# Patient Record
Sex: Male | Born: 1935 | Race: White | Hispanic: No | Marital: Married | Smoking: Never smoker
Health system: Southern US, Community
[De-identification: ages and names within clinical notes are randomized; demographics above are authoritative.]

## PROBLEM LIST (undated history)

## (undated) DIAGNOSIS — I10 Essential (primary) hypertension: Secondary | ICD-10-CM

## (undated) DIAGNOSIS — I639 Cerebral infarction, unspecified: Secondary | ICD-10-CM

## (undated) DIAGNOSIS — G629 Polyneuropathy, unspecified: Secondary | ICD-10-CM

## (undated) DIAGNOSIS — I4891 Unspecified atrial fibrillation: Secondary | ICD-10-CM

## (undated) DIAGNOSIS — I739 Peripheral vascular disease, unspecified: Secondary | ICD-10-CM

## (undated) DIAGNOSIS — I351 Nonrheumatic aortic (valve) insufficiency: Secondary | ICD-10-CM

## (undated) DIAGNOSIS — I340 Nonrheumatic mitral (valve) insufficiency: Secondary | ICD-10-CM

## (undated) DIAGNOSIS — D510 Vitamin B12 deficiency anemia due to intrinsic factor deficiency: Secondary | ICD-10-CM

## (undated) DIAGNOSIS — Z8673 Personal history of transient ischemic attack (TIA), and cerebral infarction without residual deficits: Secondary | ICD-10-CM

## (undated) HISTORY — DX: Unspecified atrial fibrillation: I48.91

## (undated) HISTORY — DX: Essential (primary) hypertension: I10

## (undated) HISTORY — DX: Nonrheumatic mitral (valve) insufficiency: I34.0

## (undated) HISTORY — DX: Nonrheumatic aortic (valve) insufficiency: I35.1

## (undated) HISTORY — DX: Personal history of transient ischemic attack (TIA), and cerebral infarction without residual deficits: Z86.73

---

## 1998-10-27 ENCOUNTER — Ambulatory Visit (HOSPITAL_COMMUNITY): Admission: RE | Admit: 1998-10-27 | Discharge: 1998-10-27 | Payer: Self-pay | Admitting: Gastroenterology

## 2000-02-26 ENCOUNTER — Emergency Department (HOSPITAL_COMMUNITY): Admission: EM | Admit: 2000-02-26 | Discharge: 2000-02-26 | Payer: Self-pay | Admitting: Emergency Medicine

## 2002-05-24 ENCOUNTER — Encounter: Payer: Self-pay | Admitting: Urology

## 2002-05-24 ENCOUNTER — Encounter: Admission: RE | Admit: 2002-05-24 | Discharge: 2002-05-24 | Payer: Self-pay | Admitting: Urology

## 2002-08-18 ENCOUNTER — Encounter: Payer: Self-pay | Admitting: Internal Medicine

## 2002-08-18 ENCOUNTER — Encounter: Admission: RE | Admit: 2002-08-18 | Discharge: 2002-08-18 | Payer: Self-pay | Admitting: Internal Medicine

## 2002-09-07 ENCOUNTER — Encounter: Payer: Self-pay | Admitting: Internal Medicine

## 2002-09-07 ENCOUNTER — Ambulatory Visit (HOSPITAL_COMMUNITY): Admission: RE | Admit: 2002-09-07 | Discharge: 2002-09-07 | Payer: Self-pay | Admitting: Internal Medicine

## 2003-10-29 ENCOUNTER — Inpatient Hospital Stay (HOSPITAL_COMMUNITY): Admission: EM | Admit: 2003-10-29 | Discharge: 2003-11-04 | Payer: Self-pay | Admitting: Emergency Medicine

## 2003-10-31 ENCOUNTER — Encounter (INDEPENDENT_AMBULATORY_CARE_PROVIDER_SITE_OTHER): Payer: Self-pay | Admitting: Cardiology

## 2004-09-11 ENCOUNTER — Ambulatory Visit (HOSPITAL_COMMUNITY): Admission: RE | Admit: 2004-09-11 | Discharge: 2004-09-11 | Payer: Self-pay | Admitting: Gastroenterology

## 2006-07-22 ENCOUNTER — Encounter: Admission: RE | Admit: 2006-07-22 | Discharge: 2006-07-22 | Payer: Self-pay | Admitting: Family Medicine

## 2007-11-13 ENCOUNTER — Ambulatory Visit: Payer: Self-pay | Admitting: Vascular Surgery

## 2010-01-18 ENCOUNTER — Ambulatory Visit: Payer: Self-pay | Admitting: Cardiology

## 2010-11-02 NOTE — Discharge Summary (Signed)
NAMEJEFFIE, SPIVACK                     ACCOUNT NO.:  [id number]   MEDICAL RECORD NO.:  [id number]                   PATIENT TYPE:  INP   LOCATION:  3002                                 FACILITY:  [facility]   PHYSICIAN:  Geoffry Paradise, M.D.               DATE OF BIRTH:  06-Jan-1925   DATE OF ADMISSION:  10/29/2003  DATE OF DISCHARGE:                                 DISCHARGE SUMMARY   DISCHARGE DIAGNOSES:  1. Right subcortical infarction, embolic.  2. Paroxysmal atrial fibrillation, controlled response.  3. Essential hypertension.  4. Gastroesophageal reflux disease.   HISTORY OF PRESENT ILLNESS:  Mr. Grave is a 77 year old gentleman with a  remote history of stroke one year prior, presenting at this time with  slurred speech and left-sided weakness.  He did have an episode of left  facial drooping and dysarthria with left hemiparesis back in March of 2004,  with negative carotid Dopplers, and CNS workup which was otherwise  unrevealing.  He has seen Dr. Elease Hashimoto in the past, two to three years ago,  with no ongoing care needed.  He represents at this time with slurred speech  and progressive left-sided weakness with the new finding being atrial  fibrillation with a slow ventricular response.  For further details, see the  dictated summary in the chart.   LABORATORY DATA:  MRI of the brain with and without contrast revealed an acute, nonhemorrhagic  infarct, posterior/ superior aspect basal ganglia extending into the  posterior aspect of the corona radiata on the right, atrophy, small vessel  disease and old infarcts, and intracranial atherosclerotic changes most  notable involving branches of the circle of Willis.  CT scan revealed no  bleed, and an old right frontal infarct, atrophy, nonspecific white matter  changes.  Carotid Dopplers were negative.   EKG:  Initially atrial fibrillation response in the 40's and 50's,  subsequently sinus bradycardia, first degree AV  block.   LABORATORY DATA:  Urine culture, no growth.  C-reactive protein 0.1.  Folate  8.8.  B12 low at 165.  TSH 1.77.  Chemistry:  Sodium 140, potassium 4.5,  chloride 108, cO2 28, glucose 105, BUN 16, creatinine 1.6, calcium 9.1,  protein 6.6, albumin 3.7, SGOT 26, SGPT 16, alkaline phosphatase 48,  bilirubin 1.1.  Homocystine elevated at 23.5.   HOSPITAL COURSE:  The patient was admitted, heparinized, given new onset  atrial fibrillation and findings, and subsequently converted to Coumadin for  long-term Coumadin therapy.  Consultations included Dr. Fraser Din of  cardiology, and Dr. Pearlean Brownie of neurology.  There was no evidence of the need  for vascular intervention.  Echocardiogram revealed no evidence of thrombus,  normal LV function.  The patient is to be scheduled by Dr. Windell Moulding  for an outpatient Cardiolite to rule out any underlying ischemia.  OT/ PT  and speech did see Mr. Zarcone, but he had subtotal recovery with total  independence, and excellent safety  recommendations for home disposition.  He  mobilized without difficulty.  Atenolol was held secondary to the  bradycardia, and even off the atenolol, his heart maintained in the 60's and  70's at best with exertion on monitor.  Blood pressure was controlled on  lisinopril.  He was supplemented with B12, 1000 micrograms and Folic acid,  given his B12 level and elevated homocystine.   DISCHARGE CONDITION:  The patient is discharged in improved and stable  condition.   DISCHARGE MEDICATIONS:  1. Coumadin 5 mg, 1.5 daily.  2. Lisinopril 20 mg, one daily.  3. Foltx/ folic acid one daily.   NEW MEDICATIONS:  Coumadin and Foltx called to [facility] at [employer].   DISCHARGE INSTRUCTIONS:  1. The patient will see medication management for prothrombin time     monitoring starting Tuesday at 9 a.m.  2. No driving for one week.  3. He will also receive vitamin B12, 1000 micrograms monthly at the time of     his Coumadin  monitoring.                                                Geoffry Paradise, M.D.    RA/MEDQ  D:  11/04/2003  T:  11/06/2003  Job:  [id number]

## 2010-11-02 NOTE — Consult Note (Signed)
NAME:  Darryl Summers, Darryl Summers                     ACCOUNT NO.:  [id number]   MEDICAL RECORD NO.:  [id number]                   PATIENT TYPE:  INP   LOCATION:  3002                                 FACILITY:  [facility]   PHYSICIAN:  Darryl Summers, M.D.               DATE OF BIRTH:  06/22/1924   DATE OF CONSULTATION:  11/01/2003  DATE OF DISCHARGE:                                   CONSULTATION   HISTORY OF PRESENT ILLNESS:  This is a 77 year old, pleasant, right handed  Caucasian gentleman who has a history of right hemispheric CVA a year ago  which caused him at that time to have left-sided numbness, clumsiness,  unsteady gait with drift to the right, inability to button his shirt and  transiently dysarthria.  The patient has recovered well until he developed  fairly similar symptoms just on Oct 29, 2003 and was admitted for stroke  workup by Dr. Lanell Summers group for slurring of speech and left leg numbness.  Due to the history of prior stroke, a comparison MRI was obtained and showed  that the patient had multiple lacunar infarctions and posterior and superior  basal ganglia bilaterally, mostly on the right.  There was encephalomalacia  from his healed stroke in the anterior right frontal lobe.  The left centrum  semiovale also shows lacune as well as the right posterior corona radiata  region into the occipital lobe.  The impression of the MRI was that the  patient had an acute ischemic small infarct along the posterior superior  aspect of the basal ganglia extending into the posterior aspect of the  corona radiata, some atrophy, multiple old infarcts with encephalomalacia  and hemorrhagic breakdown and different degrees of metabolization.  An MRA  was also obtained showing left posterior and inferior cerebral arteries were  not.  No significant focal stenosis of the basilar and vertebral artery with  some atherosclerotic type changes along the anterior circulation of both  __________  arteries.  I do not see an aneurysm.   PAST MEDICAL HISTORY:  1. Hypertension.  2. CVA, right hemispheric.  3. Increased homocystine.  4. Decreased vitamin B12 levels.  5. Newly diagnosed atrial fibrillation.  6. Gastroesophageal reflux.   FAMILY HISTORY:  Noncontributory.   SOCIAL HISTORY:  The patient is retired, married, has an active lifestyle.  Said that he is not drinking or smoking.   PHYSICAL EXAMINATION:  VITAL SIGNS:  Stable.  Afebrile.  Blood pressure  135/87, heart rate 50-60, irregular with great variability during the day.  Temperature 96 degrees.  LUNGS:  Clear to auscultation.  CARDIOVASCULAR:  No carotid bruits, no goiter.  No other bruits.  All  peripheral pulses are palpable.  No cyanosis or edema.   NEUROLOGIC EXAMINATION:  Alert and oriented x 3.  No aphasia, apraxia,  dysarthria.  Memory is intact to recent events.  The patient is very  cooperative.  Shows no aphasia either.  Cranial nerves:  Pupils are equal, reactive to light and accommodation.  Extraocular movements are intact.  Full visual fields to bilateral  simultaneous stimulation.  No papilledema.  Facial has mild asymmetry with  left lower facial droop.  The patient has good bilateral eye closure, tongue  and jugular are midline,  gag is intact.   Motor shows pronator drift on the left but fairly equal strength.  Tone is  mild throughout.  Deep tendon reflexes are elevated to 2+ versus 1+ on the  left.  There is also upgoing Babinski response on the left but no clonus.  Sensory is decreased to primary modalities on the left.  His left palm is  unable to identify objects with the same certainty as his right so that I  would describe this as a cortical sensation loss.   ASSESSMENT:  Not likely to be a recent stroke by MRA. This patient has  rather a new lacunar or perhaps had some TIA-like symptoms before a larger  blood clot broke up.  Since he has been diagnosed with atrial fibrillation,   this was most likely an embolic event.  I agree with the continuation of  Coumadin as given now.  I also agree with baby aspirin given his degree of  small vessel disease and thromboembolic event, of course, can often not be  treated with Coumadin alone.  I also agree with Dr. Lanell Summers approach to  replace vitamin B12 complex, especially folic acid should be given and the  patient should probably  be evaluated by occupational therapy for  manifestations of __________ dexterity and gait stability might also be best  evaluated by physical therapy.  A lipid profile was also obtained.  A 2-D  echocardiogram was ordered by Dr. Jacky Summers as well as multiple laboratory  studies.  Stroke service will follow Darryl Summers through his hospital stay and  he is likely to follow up the patient if he agrees to at [facility], [address], [place], [place].  Given the  patient's high degree of homocystine, it would be recommended that his  children should also be tested for this probably inherited metabolic trait.                                               Darryl Summers, M.D.    CD/MEDQ  D:  11/01/2003  T:  11/02/2003  Job:  [id number]   cc:   Darryl Summers, M.D.  [address]  [place]  [place] [address]  Fax: [phone]

## 2010-11-02 NOTE — H&P (Signed)
Darryl Summers, Darryl Summers                     ACCOUNT NO.:  [id number]   MEDICAL RECORD NO.:  [id number]                   PATIENT TYPE:  INP   LOCATION:  3002                                 FACILITY:  [facility]   PHYSICIAN:  Meade Maw, M.D.                 DATE OF BIRTH:  [birth year]   DATE OF ADMISSION:  10/29/2003  DATE OF DISCHARGE:                                HISTORY & PHYSICAL   INDICATION FOR CONSULTATION:  Paroxysmal atrial fibrillation.   HISTORY:  Darryl Summers is a very pleasant 77 year old gentleman who has a  past medical history of a CVA in 2004 at which time he suffered left facial  drooping, dysarthria, and left-sided weakness. He presented on Oct 29, 2003  with complaints of slurred speech and left-sided weakness. Two days prior to  this presentation he was noted to have worsening of his left upper extremity  and lower extremity weakness. He presented to the emergency room and was  found to be in atrial fibrillation with a slow ventricular response. He  notes that prior to this admission he had been active. He was walking three  times per week with his wife. The two days prior to this presentation he  noted disproportionate fatigue with exercise. He however, denied  palpitations, tachy arrhythmia, or dizziness. He has been evaluated by Dr.  Delane Ginger two to three years prior. He is unsure as to what workup was  performed. He states that he feels stable on his feet. He only had dizziness  with abrupt changes in position or stumble when he is rushing.   PAST MEDICAL HISTORY:  1. Significant for gastrointestinal reflux disease.  2. Has been twice on Plavix therapy.  3. Hypertension.  4. CVA as noted above.   PAST SURGICAL HISTORY:  Significant for vasectomy. He has no reported  history of dyslipidemia.   MEDICATIONS PRIOR TO ADMISSION:  1. Plavix 75 mg daily.  2. Atenolol twice daily, dose was unknown.  3. Lisinopril 20 mg daily.  4. Aspirin 81 mg daily.  5. Prilosec 20 mg daily.   CURRENT MEDICATIONS:  1. Protonix 40 mg daily.  2. Aspirin 81 mg daily.  3. Atenolol 25 mg b.i.d. Atenolol placed on hold on Nov 01, 2003.  4. Lisinopril 10 mg daily.  5. Coumadin adjusted to maintain an INR of 2-3.  6. Folic acid 2 mg daily.  7. Heparin IV.  8. Tylenol p.r.n.   ALLERGIES:  No known drug allergies.   SOCIAL HISTORY:  He has been married since 1952. His wife's name is Kendal Hymen.  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 He has a remote history of cigar  smoking and has had an active lifestyle in the past playing college  basketball and baseball. There is no history of alcohol, tobacco, or illicit  drug use.   FAMILY HISTORY:  His father passed away at 71 from a hip fracture. Mother  passed away at 63 from a myocardial infarction. He has two sons who are in  good health.   REVIEW OF SYSTEMS:  As noted above. He denies near falls. States that his  gait is only unstable when he rushes. He has had no blood in his stool. He  denies black tarry stools. He has had no orthopnea, pedal edema,  palpitations. He has had no recent upper respiratory infection.   PHYSICAL EXAMINATION:  GENERAL:  An elderly male in no acute distress.  VITAL SIGNS:  Systolic blood pressure ranging from 120-140 with a diastolic  80s. Telemetry has revealed atrial fibrillation, currently he is in sinus  rhythm with a marked first degree AV block.  HEENT:  Unremarkable.  NECK:  He has good carotid upstrokes. No carotid bruit. No neck vein  distention.  PULMONARY:  Breath sounds which are equal and clear to auscultation.  CARDIOVASCULAR:  Regular rate and rhythm. There is no rubs, murmurs, or  gallops noted. PMI is not displaced.  ABDOMEN:  Soft, benign, nontender.  EXTREMITIES:  No peripheral edema.  SKIN:  Warm and dry.  NEUROLOGIC:  As per neurology.   LABORATORY DATA:  INR is 1.2, white count 5.5, hemoglobin 12.9, hematocrit  37.1, platelets 164. His homocystine  level is noted to be elevated at 23.  LDL is 109, HDL is 43. Creatinine is 1.6. Potassium is 4.5. Sodium 140.  Normal liver enzymes. ECG reveals a marked first degree AV block. QTC at 415  milliseconds. His ECG upon admission revealed atrial fibrillation with a  ventricular response of 50 beats per minute. ECG on February 13 revealed  sinus rhythm with normal R-wave progression, normal QTC. His MRI reveals an  acute, non-hemorrhagic small infarct in the basil ganglia extending into the  right corona radiata. There is also atrophy noted and a small focal area of  hemorrhagic breakdown within the left cerebellum, which may be related to a  prior smaller hemorrhagic infarct.   IMPRESSION:  1. Atrial fibrillation, paroxysmal in nature. The patient has been started     on Coumadin. He appears to be a safe candidate. We will continue with     Coumadin for an INR of 2-3.  As the patient now is having sinus rhythm     with marked first degree AV block would not add further antiarrhythmic     therapy unless he develops recurrent atrial fibrillation. I agree with     the holding of his Atenolol. His bradycardia was at night and was     asymptomatic. We will discontinue Plavix, but would continue with aspirin     at 81 mg daily. He will need further risk stratification, but would     prefer to wait until he is able to walk without difficulty. A stress     Cardiolite would allow [place] to assess for ischemia as well for possible     exercise induced atrial fibrillation. Should he have recurrent atrial     fibrillation, would consider further antiarrhythmic therapy.  2. Hypertension.  Blood pressure is currently well-controlled, but may need     to continue to follow as the patient has discontinued his Atenolol.  3. Homocystinuria.  Agree with folic acid, B-6, and B-12 replacement.  Meade Maw, M.D.   HP/MEDQ  D:  11/01/2003  T:  11/01/2003  Job:   [id number]

## 2010-11-02 NOTE — Op Note (Signed)
NAMESHEMUEL, HARKLEROAD                ACCOUNT NO.:  [id number]   MEDICAL RECORD NO.:  [id number]          PATIENT TYPE:  AMB   LOCATION:  ENDO                         FACILITY:  [facility]   PHYSICIAN:  John C. Madilyn Fireman, M.D.    DATE OF BIRTH:  1925/03/06   DATE OF PROCEDURE:  09/11/2004  DATE OF DISCHARGE:                                 OPERATIVE REPORT   PROCEDURE:  Colonoscopy.   INDICATIONS FOR PROCEDURE:  Family history of colon cancer in a first-degree  relative.   DESCRIPTION OF PROCEDURE:  The patient was placed in the left lateral  decubitus position and placed on the pulse monitor with continuous low flow  oxygen delivered by nasal cannula.  He was sedated with 60 mcg IV Fentanyl  and 6 mg IV Versed.  The Olympus videocolonoscope was inserted into the  rectum and advanced to the cecum, confirmed by transillumination of  McBurney's point and visualization of the ileocecal valve and appendiceal  orifice.  The prep was excellent.  The cecum, ascending, and transverse  colon all appeared normal, with no masses, polyps, diverticula, or other  mucosal abnormalities.  Within the descending and sigmoid colon there was  some scattered diverticula but no other abnormalities.  The rectum appeared  normal.  On retroflex view, the anus revealed no obvious hemorrhoids.  The  scope was then withdrawn and the patient taken to the recovery room in  stable condition.  He tolerated the procedure well.  There were no immediate  complications.   IMPRESSION:  Sigmoid and descending colon diverticulosis.   PLAN:  Repeat colonoscopy in five years based on family history.       ___________________________________________  Everardo All Madilyn Fireman, M.D.    JCH/MEDQ  D:  09/11/2004  T:  09/11/2004  Job:  [id number]   cc:   Geoffry Paradise, M.D.  [address]  [place]  [place] [address]  Fax: [phone]

## 2010-11-02 NOTE — H&P (Signed)
NAMEKILIAN, SCHWARTZ                     ACCOUNT NO.:  [id number]   MEDICAL RECORD NO.:  [id number]                   PATIENT TYPE:  EMS   LOCATION:  MAJO                                 FACILITY:  [facility]   PHYSICIAN:  Mark A. Perini, M.D.                DATE OF BIRTH:  [birth year]   DATE OF ADMISSION:  10/29/2003  DATE OF DISCHARGE:                                HISTORY & PHYSICAL   PRIMARY CARE PHYSICIAN:  Geoffry Paradise, M.D.   CHIEF COMPLAINT:  1. Slurred speech.  2. Left-sided weakness.   HISTORY OF PRESENT ILLNESS:  Mr. Yapp is a 77 year old male with a history  of stroke one year ago.  He most likely did have some residual deficit from  the previous stroke, however, two days ago he noted worsening versus new  slurred speech and dysarthria.  Also, he has noted new versus worsening left  upper extremity and left lower extremity weakness and clumsiness.  Today he  presented to the emergency room.  At that time, he was found to be in new  atrial fibrillation with a slow ventricular response.  He also does have  some objective physical exam findings.  He will require admission for  further evaluation and treatment.   PAST MEDICAL HISTORY:  1. Gastrointestinal reflux disease since he was placed on Plavix therapy.  2. Hypertension.  3. In March of 2004, he suffered a stroke which resulted in left facial     drooping, dysarthria, and some left-sided weakness.  Per the patient's     history, he had negative carotid Dopplers.  4. History of vasectomy.   No history of diabetes and reportedly no history of hyperlipidemia per the  patient.  He reportedly had an EKG two to three weeks ago at an annual  physical and was not told that there was anything wrong with it.   ALLERGIES:  No known allergies.   MEDICATIONS:  1. Plavix 75 mg daily.  2. Atenolol twice daily, unknown dose.  3. Lisinopril 20 mg daily.  4. Aspirin 81 mg daily.  5. Prilosec 20 mg daily.   SOCIAL  HISTORY:  He is married since 73.  His wife's name is Kendal Hymen.  He  has two sons and four grandchildren.  He is a remote cigar smoking history.  Nothing current.  No alcohol.  No drug use history.   FAMILY HISTORY:  His father died at age 50 of a hip fracture and  complications from that.  His mother died at age 51 of an MI.  His two sons  are in good health.   REVIEW OF SYSTEMS:  Mr. Naeem was in his usual state of health until two  days ago, at which time he started slurring his words.  However, for the  last couple of months he reports having a little more trouble with his gait.  He has had some close calls with falling, but  has not had any actual falls.  He has felt tired for the last several months as well.  He denies any  fevers, cough, or cold symptoms.  He says that his appetite has been normal  and he has had no dysphagia.  Normal bowel movements.  No blood from above  or below.  He denies chest pains or shortness of breath.  He denies any  palpitations whatsoever.   PHYSICAL EXAMINATION:  VITAL SIGNS:  Temperature 97.4 degrees, pulse 51,  respiratory rate 20, blood pressure 140/72, 98% saturation on 2 L of oxygen.  GENERAL APPEARANCE:  He is in no acute distress.  He is in very good spirits  and he is jovial.  HEENT:  Normocephalic and atraumatic.  Pupils equal, round, and reactive to  light.  Extraocular movements are intact.  There is no icterus and no  pallor.  NECK:  No JVD.  No carotid bruits.  NEUROLOGIC:  Cranial nerve testing reveals slight left facial droop,  otherwise normal.  He has grossly normal strength through the upper and  lower extremities bilaterally.  However, he does have trouble with finger-  nose-finger testing with the left hand.  He is also clumsy with left-sided  heel-to-shin testing.  He has no visual field deficits.  He has a slight  left pronator drift noted today.  Toes are equivocal bilaterally.  He does  have slightly accentuated left lower  extremity knee jerk reflexes versus the  right.  Other reflexes are all normal.  LUNGS:  Clear to auscultation bilaterally with no wheezing, rhonchi, or  rales.  HEART:  Irregularly irregular with a normal rate.  No murmur, rub, or gallop  noted.  ABDOMEN:  Soft with some vague left lower quadrant tenderness to exam.  No  rebound or guarding.  EXTREMITIES:  There is no peripheral edema.  There are 2+ distal pulses  noted.   LABORATORY DATA:  The INR is 1.0 and PTT 33.  Potassium 4.5, chloride 108,  CO2 28, BUN 16, creatinine 1.6, glucose 105, total bilirubin 105, alkaline  phosphatase 48, AST 26, ALT 16, total protein 6.6, albumin 3.7, calcium 9.1.  The white count is 5.9 with a normal differential, hemoglobin 15.1, platelet  count 139,000.  Cranial CT scan personally reviewed shows an old right  frontal stroke moderate in size.  There are some white matter changes and  atrophy, but no acute bleed or stroke noted.  EKG shows atrial fibrillation  with a slow ventricular response with a rate of 50 beats per minute.  There  are no significant ST-T wave changes.   ASSESSMENT AND PLAN:  A 77 year old male with new atrial fibrillation and  subacute new versus worsening left-sided neurologic symptoms.  I suspect he  has had a subacute small right brain CVA that is new.  Will admit him to a  telemetry bed.  Will anticoagulate with heparin and initiate Coumadin  therapy.  We will stop his Plavix, but we will most likely continue him on  81 mg aspirin therapy.  We will continue his proton pump inhibitor therapy.  We will check a 2-D echocardiogram, as well as MRI and MRA of the brain and  carotid Dopplers.  He may require neurology or cardiology opinion as well.  We will also check a fasting lipid profile, C-reactive protein, homocystine  level, hemoglobin A1C, TSH, B12, and folic acid level.  Mr. Banghart is a full  code status.  Mark A. Waynard Edwards,  M.D.    MAP/MEDQ  D:  10/29/2003  T:  10/29/2003  Job:  [id number]   cc:   Geoffry Paradise, M.D.  [address]  [place]  [place] [address]  Fax: [phone]

## 2010-11-27 ENCOUNTER — Emergency Department (HOSPITAL_COMMUNITY)
Admission: EM | Admit: 2010-11-27 | Discharge: 2010-11-27 | Disposition: A | Discharge status: Home / Self Care | Payer: Medicare Other | Attending: Emergency Medicine | Admitting: Emergency Medicine

## 2010-11-27 DIAGNOSIS — I10 Essential (primary) hypertension: Secondary | ICD-10-CM | POA: Insufficient documentation

## 2010-11-27 DIAGNOSIS — I4891 Unspecified atrial fibrillation: Secondary | ICD-10-CM | POA: Insufficient documentation

## 2010-11-27 DIAGNOSIS — Z7901 Long term (current) use of anticoagulants: Secondary | ICD-10-CM | POA: Insufficient documentation

## 2010-11-27 DIAGNOSIS — F0390 Unspecified dementia without behavioral disturbance: Secondary | ICD-10-CM | POA: Insufficient documentation

## 2010-11-27 DIAGNOSIS — Z8673 Personal history of transient ischemic attack (TIA), and cerebral infarction without residual deficits: Secondary | ICD-10-CM | POA: Insufficient documentation

## 2010-11-27 DIAGNOSIS — R079 Chest pain, unspecified: Secondary | ICD-10-CM | POA: Insufficient documentation

## 2010-11-27 DIAGNOSIS — E785 Hyperlipidemia, unspecified: Secondary | ICD-10-CM | POA: Insufficient documentation

## 2010-11-27 LAB — DIFFERENTIAL
Eosinophils Absolute: 0.3 10*3/uL (ref 0.0–0.7)
Eosinophils Relative: 4 % (ref 0–5)
Lymphocytes Relative: 24 % (ref 12–46)
Monocytes Relative: 10 % (ref 3–12)
Neutro Abs: 4.4 10*3/uL (ref 1.7–7.7)
Neutrophils Relative %: 62 % (ref 43–77)

## 2010-11-27 LAB — BASIC METABOLIC PANEL
BUN: 29 mg/dL — ABNORMAL HIGH (ref 6–23)
Chloride: 95 mEq/L — ABNORMAL LOW (ref 96–112)
Creatinine, Ser: 1.7 mg/dL — ABNORMAL HIGH (ref 0.4–1.5)
GFR calc Af Amer: 47 mL/min — ABNORMAL LOW (ref >60)
Glucose, Bld: 91 mg/dL (ref 70–99)
Potassium: 3.4 mEq/L — ABNORMAL LOW (ref 3.5–5.1)
Sodium: 136 mEq/L (ref 135–145)

## 2010-11-27 LAB — TROPONIN I: Troponin I: <0.3 ng/mL (ref <0.30)

## 2010-11-27 LAB — CK TOTAL AND CKMB (NOT AT ARMC)
CK, MB: 4.9 ng/mL — ABNORMAL HIGH (ref 0.3–4.0)
Relative Index: 1.5 (ref 0.0–2.5)

## 2010-11-27 LAB — PROTIME-INR
INR: 2.83 — ABNORMAL HIGH (ref 0.00–1.49)
Prothrombin Time: 29.8 seconds — ABNORMAL HIGH (ref 11.6–15.2)

## 2010-11-27 LAB — CBC: Platelets: 199 10*3/uL (ref 150–400)

## 2011-11-29 ENCOUNTER — Encounter: Payer: Self-pay | Admitting: Cardiology

## 2012-08-28 ENCOUNTER — Encounter: Payer: Self-pay | Admitting: Cardiovascular Disease

## 2012-08-28 ENCOUNTER — Encounter: Payer: Self-pay | Admitting: Cardiology

## 2013-03-05 ENCOUNTER — Emergency Department (HOSPITAL_COMMUNITY): Payer: Medicare Other

## 2013-03-05 ENCOUNTER — Encounter (HOSPITAL_COMMUNITY): Payer: Self-pay | Admitting: Neurology

## 2013-03-05 ENCOUNTER — Emergency Department (HOSPITAL_COMMUNITY)
Admission: EM | Admit: 2013-03-05 | Discharge: 2013-03-05 | Disposition: A | Discharge status: Other Acute Inpatient Hospital | Payer: Medicare Other | Attending: Emergency Medicine | Admitting: Emergency Medicine

## 2013-03-05 DIAGNOSIS — Y9389 Activity, other specified: Secondary | ICD-10-CM | POA: Insufficient documentation

## 2013-03-05 DIAGNOSIS — W050XXA Fall from non-moving wheelchair, initial encounter: Secondary | ICD-10-CM | POA: Insufficient documentation

## 2013-03-05 DIAGNOSIS — Z792 Long term (current) use of antibiotics: Secondary | ICD-10-CM | POA: Insufficient documentation

## 2013-03-05 DIAGNOSIS — N390 Urinary tract infection, site not specified: Secondary | ICD-10-CM | POA: Insufficient documentation

## 2013-03-05 DIAGNOSIS — I4891 Unspecified atrial fibrillation: Secondary | ICD-10-CM | POA: Insufficient documentation

## 2013-03-05 DIAGNOSIS — Z8673 Personal history of transient ischemic attack (TIA), and cerebral infarction without residual deficits: Secondary | ICD-10-CM | POA: Insufficient documentation

## 2013-03-05 DIAGNOSIS — S0990XA Unspecified injury of head, initial encounter: Secondary | ICD-10-CM

## 2013-03-05 DIAGNOSIS — I10 Essential (primary) hypertension: Secondary | ICD-10-CM | POA: Insufficient documentation

## 2013-03-05 DIAGNOSIS — W19XXXA Unspecified fall, initial encounter: Secondary | ICD-10-CM

## 2013-03-05 DIAGNOSIS — Z7901 Long term (current) use of anticoagulants: Secondary | ICD-10-CM | POA: Insufficient documentation

## 2013-03-05 DIAGNOSIS — Y9210 Unspecified residential institution as the place of occurrence of the external cause: Secondary | ICD-10-CM | POA: Insufficient documentation

## 2013-03-05 DIAGNOSIS — Z79899 Other long term (current) drug therapy: Secondary | ICD-10-CM | POA: Insufficient documentation

## 2013-03-05 HISTORY — DX: Cerebral infarction, unspecified: I63.9

## 2013-03-05 LAB — URINALYSIS, ROUTINE W REFLEX MICROSCOPIC
Ketones, ur: NEGATIVE mg/dL
Leukocytes, UA: NEGATIVE
Nitrite: NEGATIVE
Specific Gravity, Urine: 1.02 (ref 1.005–1.030)
Urobilinogen, UA: 0.2 mg/dL (ref 0.0–1.0)

## 2013-03-05 LAB — BASIC METABOLIC PANEL
Calcium: 9.1 mg/dL (ref 8.4–10.5)
GFR calc Af Amer: 33 mL/min — ABNORMAL LOW (ref >90)
Sodium: 137 mEq/L (ref 135–145)

## 2013-03-05 LAB — URINE MICROSCOPIC-ADD ON

## 2013-03-05 LAB — CBC WITH DIFFERENTIAL/PLATELET
Basophils Relative: 0 % (ref 0–1)
HCT: 45.7 % (ref 39.0–52.0)
Lymphocytes Relative: 15 % (ref 12–46)
Lymphs Abs: 1.7 10*3/uL (ref 0.7–4.0)
MCHC: 34.6 g/dL (ref 30.0–36.0)
MCV: 96.2 fL (ref 78.0–100.0)
Monocytes Absolute: 1 10*3/uL (ref 0.1–1.0)
Monocytes Relative: 9 % (ref 3–12)
Platelets: 150 10*3/uL (ref 150–400)
RBC: 4.75 MIL/uL (ref 4.22–5.81)
WBC: 11.1 10*3/uL — ABNORMAL HIGH (ref 4.0–10.5)

## 2013-03-05 LAB — PROTIME-INR: Prothrombin Time: 19.7 seconds — ABNORMAL HIGH (ref 11.6–15.2)

## 2013-03-05 MED ORDER — CEPHALEXIN 500 MG PO CAPS: 500.0000 mg | ORAL_CAPSULE | Freq: Two times a day (BID) | ORAL | Status: DC

## 2013-03-05 NOTE — ED Provider Notes (Signed)
CSN: [id number]     Arrival date & time 03/05/13  1033 History   First MD Initiated Contact with Patient 03/05/13 1033     Chief Complaint  Patient presents with  . Fall   (Consider location/radiation/quality/duration/timing/severity/associated sxs/prior Treatment) HPI This is an 79 yo on coumadin for afib who presents from his assisted living following a fall.  Patient only oriented to self.  States "I didn't want to come."  Patient does not contribute to history taking.  Per son, patient has had 2-3 falls over the last 24 hours.  Last fall was secondary to handle beside the toilet pulling out of the wall.  Patient hit his head on the wheelchair.  No LOC.  He reports increasing confusion without notable focal deficit.  No reported fevers. Past Medical History  Diagnosis Date  . Atrial fibrillation   . History of stroke   . Hypertension   . Aortic insufficiency   . Moderate mitral regurgitation   . Stroke    History reviewed. No pertinent past surgical history. No family history on file. History  Substance Use Topics  . Smoking status: Never Smoker   . Smokeless tobacco: Not on file  . Alcohol Use: No    Review of Systems  Unable to perform ROS: Dementia    Allergies  Review of patient's allergies indicates no known allergies.  Home Medications   Current Outpatient Rx  Name  Route  Sig  Dispense  Refill  . divalproex (DEPAKOTE) 125 MG DR tablet   Oral   Take 125 mg by mouth 2 (two) times daily.         Marland Kitchen donepezil (ARICEPT) 10 MG tablet   Oral   Take 10 mg by mouth at bedtime.         Marland Kitchen FA-Pyridoxine-Cyancobalamin (FOLBIC PO)   Oral   Take 1 tablet by mouth daily.          . hydrochlorothiazide (HYDRODIURIL) 25 MG tablet   Oral   Take 12.5 mg by mouth daily.         . memantine (NAMENDA) 10 MG tablet   Oral   Take 10 mg by mouth 2 (two) times daily.         Marland Kitchen warfarin (COUMADIN) 5 MG tablet   Oral   Take 2.5-5 mg by mouth daily. Except  Wednesdays take 2.5 mg tablet.         . cephALEXin (KEFLEX) 500 MG capsule   Oral   Take 1 capsule (500 mg total) by mouth 2 (two) times daily.   14 capsule   0    BP 128/51  Pulse 71  Temp(Src) 98.3 F (36.8 C) (Oral)  Resp 13  SpO2 95% Physical Exam  Nursing note and vitals reviewed. Constitutional: He appears well-developed and well-nourished. No distress.  HENT:  Head: Normocephalic and atraumatic.  Right Ear: External ear normal.  Left Ear: External ear normal.  Mouth/Throat: Oropharynx is clear and moist.  Eyes: EOM are normal. Pupils are equal, round, and reactive to light.  Neck: Neck supple.  No c spine tenderness  Cardiovascular: Normal rate, regular rhythm and normal heart sounds.   No murmur heard. Pulmonary/Chest: Effort normal and breath sounds normal. No respiratory distress. He has no wheezes.  Abdominal: Soft. Bowel sounds are normal. There is no tenderness. There is no rebound.  Musculoskeletal: He exhibits no edema.  Lymphadenopathy:    He has no cervical adenopathy.  Neurological: He is alert.  Oriented to  person only, moves all four extremities, follows commands, CN 2-12 grossly intact  Skin: Skin is warm and dry.  Not ecchymosis noted  Psychiatric: He has a normal mood and affect.    ED Course  Procedures (including critical care time) Labs Review Labs Reviewed  CBC WITH DIFFERENTIAL - Abnormal; Notable for the following:    WBC 11.1 (*)    Neutro Abs 8.3 (*)    All other components within normal limits  PROTIME-INR - Abnormal; Notable for the following:    Prothrombin Time 19.7 (*)    INR 1.72 (*)    All other components within normal limits  BASIC METABOLIC PANEL - Abnormal; Notable for the following:    Glucose, Bld 112 (*)    BUN 28 (*)    Creatinine, Ser 2.04 (*)    GFR calc non Af Amer 28 (*)    GFR calc Af Amer 33 (*)    All other components within normal limits  URINALYSIS, ROUTINE W REFLEX MICROSCOPIC - Abnormal; Notable for  the following:    Hgb urine dipstick MODERATE (*)    Protein, ur 30 (*)    All other components within normal limits  URINE MICROSCOPIC-ADD ON   Imaging Review Ct Head Wo Contrast  03/05/2013   CLINICAL DATA:  History of dementia, fall  EXAM: CT HEAD WITHOUT CONTRAST  TECHNIQUE: Contiguous axial images were obtained from the base of the skull through the vertex without intravenous contrast.  COMPARISON:  10/29/2003  FINDINGS: No skull fracture is noted. Stable cerebral atrophy. No intracranial hemorrhage, mass effect or midline shift.  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 No acute cortical infarction. No mass lesion is noted on this unenhanced scan. Again noted periventricular and patchy subcortical white matter decreased attenuation consistent with chronic small vessel ischemic changes. Ventricular size is stable from prior exam.  IMPRESSION: No acute intracranial abnormality. Again noted encephalomalacia due to old infarct in right frontal lobe. Stable cerebral atrophy and chronic white matter disease.   Electronically Signed   By: Natasha Mead   On: 03/05/2013 13:16    MDM   1. Fall at nursing home, initial encounter   2. Minor head injury without loss of consciousness, initial encounter   3. UTI (lower urinary tract infection)    79 yo male with fall.  Falls described as mechanical.  No syncope or seizure noted.  Nontoxic and without evidence fo trauma.  Non-focal on neuro exam.  CT head negative for acute abnormality.  Urine to +leuks and bacteria which would be abnormal for a male.  Culture sent.  Patient will be treated for UTI.  Discussed work-up and findings with son.  Patient agrees with plan.  After history, exam, and medical workup I feel the patient has been appropriately medically screened and is safe for discharge home. Pertinent diagnoses were discussed with the patient. Patient was given return precautions.    Shon Baton, MD 03/05/13  [phone]

## 2013-03-05 NOTE — ED Notes (Signed)
Pt taken back to [facility] with [facility]. Rx provided for UTI. Report called to facility. Pt at baseline at time of discharge.

## 2013-03-05 NOTE — ED Notes (Signed)
Called CT to check on when patient will be coming over, reporting he is one of the next ones they will send for.

## 2013-03-05 NOTE — ED Notes (Signed)
Myself and Tramaine, EMT undressed pt, placed in gown, on monitor, continuous pulse oximetry and blood pressure cuff and performed vitals; family at bedside

## 2013-03-05 NOTE — ED Notes (Signed)
[facility] notified pt needs transport.

## 2013-03-05 NOTE — ED Notes (Signed)
Pt comes from [facility] where he lives. He has hx of dementia. Today he had a fall 2-3 ft from wheelchair and pulled the pull handle out of the wall. He hit his head on the foot of the wheelchair and does take coumadin. He also fell last night but his son did not want transport. Pt son reporting that he is at baseline as far as confusion and pt reports he does not want to be here. Staff reports that he is more angry than normal, question a UTI?  Pt is denying any pain, BP 146/90, HR 86 irregular with hx of AFIB. CBG 152.

## 2013-05-07 ENCOUNTER — Emergency Department (HOSPITAL_COMMUNITY)
Admission: EM | Admit: 2013-05-07 | Discharge: 2013-05-07 | Disposition: A | Discharge status: Home / Self Care | Payer: Medicare Other | Attending: Emergency Medicine | Admitting: Emergency Medicine

## 2013-05-07 ENCOUNTER — Encounter (HOSPITAL_COMMUNITY): Payer: Self-pay | Admitting: Emergency Medicine

## 2013-05-07 ENCOUNTER — Emergency Department (HOSPITAL_COMMUNITY): Payer: Medicare Other

## 2013-05-07 DIAGNOSIS — Y9239 Other specified sports and athletic area as the place of occurrence of the external cause: Secondary | ICD-10-CM | POA: Insufficient documentation

## 2013-05-07 DIAGNOSIS — Z8673 Personal history of transient ischemic attack (TIA), and cerebral infarction without residual deficits: Secondary | ICD-10-CM | POA: Insufficient documentation

## 2013-05-07 DIAGNOSIS — S0083XA Contusion of other part of head, initial encounter: Secondary | ICD-10-CM

## 2013-05-07 DIAGNOSIS — W010XXA Fall on same level from slipping, tripping and stumbling without subsequent striking against object, initial encounter: Secondary | ICD-10-CM | POA: Insufficient documentation

## 2013-05-07 DIAGNOSIS — I10 Essential (primary) hypertension: Secondary | ICD-10-CM | POA: Insufficient documentation

## 2013-05-07 DIAGNOSIS — Y9389 Activity, other specified: Secondary | ICD-10-CM | POA: Insufficient documentation

## 2013-05-07 DIAGNOSIS — Z7901 Long term (current) use of anticoagulants: Secondary | ICD-10-CM | POA: Insufficient documentation

## 2013-05-07 DIAGNOSIS — IMO0002 Reserved for concepts with insufficient information to code with codable children: Secondary | ICD-10-CM | POA: Insufficient documentation

## 2013-05-07 DIAGNOSIS — Z23 Encounter for immunization: Secondary | ICD-10-CM | POA: Insufficient documentation

## 2013-05-07 DIAGNOSIS — S0003XA Contusion of scalp, initial encounter: Secondary | ICD-10-CM | POA: Insufficient documentation

## 2013-05-07 DIAGNOSIS — Z79899 Other long term (current) drug therapy: Secondary | ICD-10-CM | POA: Insufficient documentation

## 2013-05-07 DIAGNOSIS — I4891 Unspecified atrial fibrillation: Secondary | ICD-10-CM | POA: Insufficient documentation

## 2013-05-07 DIAGNOSIS — I080 Rheumatic disorders of both mitral and aortic valves: Secondary | ICD-10-CM | POA: Insufficient documentation

## 2013-05-07 LAB — PROTIME-INR: INR: 2.17 — ABNORMAL HIGH (ref 0.00–1.49)

## 2013-05-07 MED ORDER — TETANUS-DIPHTH-ACELL PERTUSSIS 5-2.5-18.5 LF-MCG/0.5 IM SUSP
0.5000 mL | Freq: Once | INTRAMUSCULAR | Status: AC
  Administered 2013-05-07: 0.5 mL via INTRAMUSCULAR
  Filled 2013-05-07: qty 0.5

## 2013-05-07 NOTE — ED Notes (Addendum)
Per ems pt from [facility] s/p fall lac to nose pt is on blood thinner(Jantoven) denies loc pt on arrival awake and alert.Bleeding controlled

## 2013-05-07 NOTE — ED Provider Notes (Signed)
CSN: [id number]     Arrival date & time 05/07/13  2104 History   First MD Initiated Contact with Patient 05/07/13 2133     Chief Complaint  Patient presents with  . Fall   (Consider location/radiation/quality/duration/timing/severity/associated sxs/prior Treatment) Patient is a 79 y.o. male presenting with fall. The history is provided by the patient and the EMS personnel. The history is limited by the condition of the patient.  Fall Pertinent negatives include no chest pain, no abdominal pain, no headaches and no shortness of breath.  pt with hx afib, on coumadin, presents from [facility] s/p fall. Pt notes trip and fall, fell forward. Denies faintness, dizziness or loc. Contusion to face/nose. Abrasion to nose. Tetanus unknown. Pt denies pain. No neck or back pain. No cp or sob. No abd pain or nv. Pt poor historian - level 5 caveat.      Past Medical History  Diagnosis Date  . Atrial fibrillation   . History of stroke   . Hypertension   . Aortic insufficiency   . Moderate mitral regurgitation   . Stroke    History reviewed. No pertinent past surgical history. History reviewed. No pertinent family history. History  Substance Use Topics  . Smoking status: Never Smoker   . Smokeless tobacco: Not on file  . Alcohol Use: No    Review of Systems  Constitutional: Negative for fever.  HENT: Negative for sore throat.   Eyes: Negative for redness.  Respiratory: Negative for shortness of breath.   Cardiovascular: Negative for chest pain.  Gastrointestinal: Negative for vomiting and abdominal pain.  Genitourinary: Negative for flank pain.  Musculoskeletal: Negative for back pain and neck pain.  Skin: Negative for rash.  Neurological: Negative for headaches.  Hematological: Does not bruise/bleed easily.  Psychiatric/Behavioral: Negative for confusion.    Allergies  Review of patient's allergies indicates no known allergies.  Home Medications   Current Outpatient Rx  Name  Route   Sig  Dispense  Refill  . acetaminophen (TYLENOL) 325 MG tablet   Oral   Take 650 mg by mouth every 6 (six) hours as needed for mild pain.         . divalproex (DEPAKOTE) 125 MG DR tablet   Oral   Take 125 mg by mouth 2 (two) times daily.         Marland Kitchen donepezil (ARICEPT) 10 MG tablet   Oral   Take 10 mg by mouth at bedtime.         Marland Kitchen FA-Pyridoxine-Cyancobalamin (FOLBIC PO)   Oral   Take 1 tablet by mouth daily.          . hydrochlorothiazide (HYDRODIURIL) 25 MG tablet   Oral   Take 12.5 mg by mouth daily.         Marland Kitchen loperamide (IMODIUM) 2 MG capsule   Oral   Take 2 mg by mouth every 8 (eight) hours as needed for diarrhea or loose stools.         Marland Kitchen LORazepam (ATIVAN) 1 MG tablet   Oral   Take 1 mg by mouth every 6 (six) hours as needed for anxiety.         . memantine (NAMENDA) 10 MG tablet   Oral   Take 10 mg by mouth 2 (two) times daily.         Marland Kitchen warfarin (COUMADIN) 5 MG tablet   Oral   Take 2.5-5 mg by mouth daily. Jantoven. Take 5 mg on Tuesday, Wednesday, Friday, Saturday, and  Sunday.  Take 2.5 mg on Monday and Thursday.          BP 149/74  Pulse 75  Temp(Src) 97.5 F (36.4 C) (Oral)  Resp 20  SpO2 97% Physical Exam  Nursing note and vitals reviewed. Constitutional: He appears well-developed and well-nourished. No distress.  HENT:  Mouth/Throat: Oropharynx is clear and moist.  Contusion/abrasion to nose. No septal hematoma or active bleeding.   Eyes: Conjunctivae and EOM are normal. Pupils are equal, round, and reactive to light.  Neck: Normal range of motion. Neck supple. No tracheal deviation present.  Cardiovascular: Normal rate, normal heart sounds and intact distal pulses.   Pulmonary/Chest: Effort normal and breath sounds normal. No accessory muscle usage. No respiratory distress. He exhibits no tenderness.  Abdominal: Soft. He exhibits no distension and no mass. There is no tenderness. There is no rebound and no guarding.   Musculoskeletal: Normal range of motion. He exhibits no edema and no tenderness.  CTLS spine, non tender, aligned, no step off. Good rom bil ext without pain or focal bony tenderness.   Neurological: He is alert.  Awake and alert. Motor intact bil, 5/5.   Skin: Skin is warm and dry. He is not diaphoretic.  Psychiatric: He has a normal mood and affect.    ED Course  Procedures (including critical care time)  EKG Interpretation   None      Results for orders placed during the hospital encounter of 05/07/13  PROTIME-INR      Result Value Range   Prothrombin Time 23.5 (*) 11.6 - 15.2 seconds   INR 2.17 (*) 0.00 - 1.49   Ct Head Wo Contrast  05/07/2013   CLINICAL DATA:  Fall with nose abrasion in confusion  EXAM: CT HEAD WITHOUT CONTRAST  TECHNIQUE: Contiguous axial images were obtained from the base of the skull through the vertex without intravenous contrast.  COMPARISON:  02/23/2013  FINDINGS: Skull and Sinuses:No significant abnormality.  Orbits: Bilateral cataract resection.  Brain: No evidence of acute abnormality, such as acute infarction, hemorrhage, hydrocephalus, or mass lesion/mass effect. Unchanged extensive encephalomalacia and gliosis at the anterior and posterior margins of the right MCA territory. Extensive chronic small vessel ischemic change with diffuse bilateral cerebral white matter low-attenuation. Remote small vessel infarct in the upper left peripheral cerebellum. Cerebral volume loss.  IMPRESSION: 1. No evidence of acute intracranial injury. 2. Stable pattern of large and small-vessel ischemic injuries.   Electronically Signed   By: Tiburcio Pea M.D.   On: 05/07/2013 22:36     MDM  Ct. Lab.  Reviewed nursing notes and prior charts for additional history.   Recheck pt alert, content. Requests d/c.  Ct neg acute. Pt appears stable for d/c.       Suzi Roots, MD 05/07/13 2253

## 2013-05-07 NOTE — ED Notes (Signed)
Bed: WA08 Expected date:  Expected time:  Means of arrival:  Comments: EMS 79yo M, fall, laceration

## 2013-05-07 NOTE — ED Notes (Signed)
[facility] called  

## 2014-05-12 ENCOUNTER — Emergency Department (HOSPITAL_COMMUNITY): Payer: Medicare Other

## 2014-05-12 ENCOUNTER — Encounter (HOSPITAL_COMMUNITY): Payer: Self-pay | Admitting: Emergency Medicine

## 2014-05-12 ENCOUNTER — Emergency Department (HOSPITAL_COMMUNITY)
Admission: EM | Admit: 2014-05-12 | Discharge: 2014-05-12 | Disposition: A | Discharge status: Home / Self Care | Payer: Medicare Other | Attending: Emergency Medicine | Admitting: Emergency Medicine

## 2014-05-12 DIAGNOSIS — W1849XA Other slipping, tripping and stumbling without falling, initial encounter: Secondary | ICD-10-CM | POA: Diagnosis not present

## 2014-05-12 DIAGNOSIS — Z8673 Personal history of transient ischemic attack (TIA), and cerebral infarction without residual deficits: Secondary | ICD-10-CM | POA: Diagnosis not present

## 2014-05-12 DIAGNOSIS — Z79899 Other long term (current) drug therapy: Secondary | ICD-10-CM | POA: Diagnosis not present

## 2014-05-12 DIAGNOSIS — Y929 Unspecified place or not applicable: Secondary | ICD-10-CM | POA: Diagnosis not present

## 2014-05-12 DIAGNOSIS — Z7982 Long term (current) use of aspirin: Secondary | ICD-10-CM | POA: Insufficient documentation

## 2014-05-12 DIAGNOSIS — S0083XA Contusion of other part of head, initial encounter: Secondary | ICD-10-CM | POA: Insufficient documentation

## 2014-05-12 DIAGNOSIS — S0990XA Unspecified injury of head, initial encounter: Secondary | ICD-10-CM

## 2014-05-12 DIAGNOSIS — Y998 Other external cause status: Secondary | ICD-10-CM | POA: Diagnosis not present

## 2014-05-12 DIAGNOSIS — F0390 Unspecified dementia without behavioral disturbance: Secondary | ICD-10-CM | POA: Diagnosis not present

## 2014-05-12 DIAGNOSIS — T148XXA Other injury of unspecified body region, initial encounter: Secondary | ICD-10-CM

## 2014-05-12 DIAGNOSIS — Y9301 Activity, walking, marching and hiking: Secondary | ICD-10-CM | POA: Diagnosis not present

## 2014-05-12 DIAGNOSIS — I10 Essential (primary) hypertension: Secondary | ICD-10-CM | POA: Insufficient documentation

## 2014-05-12 DIAGNOSIS — W19XXXA Unspecified fall, initial encounter: Secondary | ICD-10-CM

## 2014-05-12 LAB — COMPREHENSIVE METABOLIC PANEL
ALK PHOS: 85 U/L (ref 39–117)
ALT: 18 U/L (ref 0–53)
AST: 34 U/L (ref 0–37)
Albumin: 3.4 g/dL — ABNORMAL LOW (ref 3.5–5.2)
Anion gap: 9 (ref 5–15)
BILIRUBIN TOTAL: 0.8 mg/dL (ref 0.3–1.2)
BUN: 26 mg/dL — ABNORMAL HIGH (ref 6–23)
CHLORIDE: 100 meq/L (ref 96–112)
CO2: 28 meq/L (ref 19–32)
Calcium: 9.5 mg/dL (ref 8.4–10.5)
Creatinine, Ser: 1.51 mg/dL — ABNORMAL HIGH (ref 0.50–1.35)
GFR, EST AFRICAN AMERICAN: 46 mL/min — AB (ref >90)
GFR, EST NON AFRICAN AMERICAN: 40 mL/min — AB (ref >90)
GLUCOSE: 99 mg/dL (ref 70–99)
POTASSIUM: 4.4 meq/L (ref 3.7–5.3)
SODIUM: 137 meq/L (ref 137–147)
Total Protein: 7.9 g/dL (ref 6.0–8.3)

## 2014-05-12 LAB — CBC WITH DIFFERENTIAL/PLATELET
BASOS ABS: 0.1 10*3/uL (ref 0.0–0.1)
Basophils Relative: 1 % (ref 0–1)
EOS PCT: 5 % (ref 0–5)
Eosinophils Absolute: 0.4 10*3/uL (ref 0.0–0.7)
HCT: 44.6 % (ref 39.0–52.0)
Hemoglobin: 14.4 g/dL (ref 13.0–17.0)
LYMPHS ABS: 1.8 10*3/uL (ref 0.7–4.0)
LYMPHS PCT: 23 % (ref 12–46)
MCH: 30.9 pg (ref 26.0–34.0)
MCHC: 32.3 g/dL (ref 30.0–36.0)
MCV: 95.7 fL (ref 78.0–100.0)
Monocytes Absolute: 0.6 10*3/uL (ref 0.1–1.0)
Monocytes Relative: 7 % (ref 3–12)
NEUTROS ABS: 5.1 10*3/uL (ref 1.7–7.7)
NEUTROS PCT: 64 % (ref 43–77)
PLATELETS: 190 10*3/uL (ref 150–400)
RBC: 4.66 MIL/uL (ref 4.22–5.81)
RDW: 14.3 % (ref 11.5–15.5)
WBC: 8 10*3/uL (ref 4.0–10.5)

## 2014-05-12 LAB — URINALYSIS, ROUTINE W REFLEX MICROSCOPIC
BILIRUBIN URINE: NEGATIVE
GLUCOSE, UA: NEGATIVE mg/dL
Hgb urine dipstick: NEGATIVE
KETONES UR: NEGATIVE mg/dL
Leukocytes, UA: NEGATIVE
NITRITE: NEGATIVE
PH: 6.5 (ref 5.0–8.0)
Protein, ur: NEGATIVE mg/dL
SPECIFIC GRAVITY, URINE: 1.017 (ref 1.005–1.030)
Urobilinogen, UA: 0.2 mg/dL (ref 0.0–1.0)

## 2014-05-12 LAB — PROTIME-INR
INR: 1.13 (ref 0.00–1.49)
Prothrombin Time: 14.6 seconds (ref 11.6–15.2)

## 2014-05-12 LAB — TROPONIN I

## 2014-05-12 NOTE — ED Notes (Signed)
Per EMS. PT from [facility]. Hx of dementia, oriented per norm according to staff. Staff told EMS pt was walking with walker this am, tripped and had witnessed fall. Hit posterior head on back of table. Pt recently stopped taking Jantoven on 11/20 for a fib. Pt denies any pain

## 2014-05-12 NOTE — ED Notes (Signed)
Bed: WA09 Expected date:  Expected time:  Means of arrival:  Comments: Ems-elderly fall,

## 2014-05-12 NOTE — ED Notes (Signed)
Pt given urinal and made aware of need for urine specimen. Pt states he doesn't need to void at the moment

## 2014-05-12 NOTE — ED Provider Notes (Signed)
CSN: [id number]     Arrival date & time 05/12/14  [id number] History   First MD Initiated Contact with Patient 05/12/14 [phone]     Chief Complaint  Patient presents with  . Fall     (Consider location/radiation/quality/duration/timing/severity/associated sxs/prior Treatment) The history is provided by the patient and the EMS personnel. No language interpreter was used.  Rogen Clabe SealKester Kirlin is an 80 y/o M with PMHx of atrial fibrillation, stroke, hypertension, aortic insufficiency and moderate mitral valve regurgitation presenting to the ED with a fall that occurred early this morning. [facility]. As per EMS report, stated that staff saw patient walking with walker where he then tripped landing backwards and hitting his head on the table. Patient denied any complaints. Patient reported that he is in good shape for an old man.  PCP none  Level 5 caveat   Past Medical History  Diagnosis Date  . Atrial fibrillation   . History of stroke   . Hypertension   . Aortic insufficiency   . Moderate mitral regurgitation   . Stroke    History reviewed. No pertinent past surgical history. History reviewed. No pertinent family history. History  Substance Use Topics  . Smoking status: Never Smoker   . Smokeless tobacco: Not on file  . Alcohol Use: No    Review of Systems  Unable to perform ROS: Dementia  Eyes: Negative for visual disturbance.  Cardiovascular: Negative for chest pain.  Musculoskeletal: Negative for back pain, neck pain and neck stiffness.  Neurological: Negative for dizziness and headaches.      Allergies  Review of patient's allergies indicates no known allergies.  Home Medications   Prior to Admission medications   Medication Sig Start Date End Date Taking? Authorizing Provider  acetaminophen (TYLENOL) 325 MG tablet Take 650 mg by mouth every 6 (six) hours as needed for mild pain.   Yes Historical  Provider, MD  amLODipine (NORVASC) 5 MG tablet Take 5 mg by mouth every morning.   Yes Historical Provider, MD  aspirin 325 MG tablet Take 325 mg by mouth daily.   Yes Historical Provider, MD  Cholecalciferol (VITAMIN D) 2000 UNITS tablet Take 2,000 Units by mouth daily.   Yes Historical Provider, MD  divalproex (DEPAKOTE) 125 MG DR tablet Take 125 mg by mouth 2 (two) times daily.   Yes Historical Provider, MD  donepezil (ARICEPT) 10 MG tablet Take 10 mg by mouth at bedtime.   Yes Historical Provider, MD  FA-Pyridoxine-Cyancobalamin (FOLBIC PO) Take 1 tablet by mouth daily.    Yes Historical Provider, MD  feeding supplement (ENSURE IMMUNE HEALTH) LIQD Take 237 mLs by mouth daily.   Yes Historical Provider, MD  loperamide (IMODIUM) 2 MG capsule Take 2 mg by mouth every 8 (eight) hours as needed for diarrhea or loose stools.   Yes Historical Provider, MD  LORazepam (ATIVAN) 1 MG tablet Take 1 mg by mouth every 6 (six) hours as needed for anxiety.   Yes Historical Provider, MD  memantine (NAMENDA) 10 MG tablet Take 10 mg by mouth 2 (two) times daily.   Yes Historical Provider, MD  pantoprazole (PROTONIX) 20 MG tablet Take 20 mg by mouth daily.   Yes Historical Provider, MD   BP 118/74 mmHg  Pulse 74  Temp(Src) 97.7 F (36.5 C) (Oral)  Resp 20  SpO2 96% Physical Exam  Constitutional: He appears well-developed and well-nourished. No distress.  HENT:  Head: Normocephalic.  Mouth/Throat: Oropharynx is clear and moist. No oropharyngeal exudate.  Hematoma identified to the left occipital region of the scalp. Negative lacerations identified to the scalp. Negative pain upon palpation to the skull/scalp. Negative crepitus upon palpation. Negative depressions.  Eyes: Conjunctivae and EOM are normal. Pupils are equal, round, and reactive to light. Right eye exhibits no discharge. Left eye exhibits no discharge.  Neck: Normal range of motion. Neck supple. No tracheal deviation present.  Negative neck  stiffness Negative nuchal rigidity Negative pain upon palpation to the C-spine Full range of motion, neck supple without difficulty or ataxia with motion  Cardiovascular: Normal rate, regular rhythm and normal heart sounds.   Pulses:      Radial pulses are 2+ on the right side, and 2+ on the left side.       Dorsalis pedis pulses are 2+ on the right side, and 2+ on the left side.  Cap refill less than 3 seconds Negative swelling or pitting edema noted to the lower extremities bilaterally   Pulmonary/Chest: Effort normal and breath sounds normal. No respiratory distress. He has no wheezes. He has no rales. He exhibits no tenderness.  Patient is able to speak in full sentences without difficulty Negative use of accessory muscles Negative stridor Negative deformities or signs of trauma-negative ecchymosis Negative pain upon palpation to the chest wall Negative tenting of the clavicles or chest wall Negative crepitus  Abdominal: Soft. Bowel sounds are normal. He exhibits no distension. There is no tenderness. There is no rebound and no guarding.  Musculoskeletal: Normal range of motion. He exhibits no edema or tenderness.  Negative deformities or signs of trauma noted to the spine. Negative pain upon palpation to the spine.  Negative signs of deformities to the upper and lower extremities. Negative signs of trauma. Negative ecchymosis. Full ROM to upper and lower extremities without difficulty noted, negative ataxia noted.  Lymphadenopathy:    He has no cervical adenopathy.  Neurological: He is alert. No cranial nerve deficit. He exhibits normal muscle tone. Coordination normal.  Cranial nerves III-XII grossly intact Strength 5+/5+ to upper and lower extremities bilaterally with resistance applied, equal distribution noted Equal grip strength bilaterally Negative facial droop Negative slurred speech Negative aphasia Patient in denial that he is 80 years old, reported that he is 80 years  old Continues to repeat that there is nothing wrong with him that he is in "good shape for an old man." Patient follows commands well Patient response to questions appropriately Negative arm drift Fine motor skills intact Heel to knee down shin normal bilaterally  Skin: Skin is warm and dry. No rash noted. He is not diaphoretic. No erythema.  Psychiatric: He has a normal mood and affect. His behavior is normal. Thought content normal.  Nursing note and vitals reviewed.   ED Course  Procedures (including critical care time)  Results for orders placed or performed during the hospital encounter of 05/12/14  Troponin I  Result Value Ref Range   Troponin I <0.30 <0.30 ng/mL  CBC with Differential  Result Value Ref Range   WBC 8.0 4.0 - 10.5 K/uL   RBC 4.66 4.22 - 5.81 MIL/uL   Hemoglobin 14.4 13.0 - 17.0 g/dL   HCT 16.1 09.6 - 04.5 %   MCV 95.7 78.0 - 100.0 fL   MCH 30.9 26.0 - 34.0 pg   MCHC 32.3 30.0 - 36.0 g/dL   RDW 40.9 81.1 - 91.4 %   Platelets 190 150 - 400 K/uL  Neutrophils Relative % 64 43 - 77 %   Neutro Abs 5.1 1.7 - 7.7 K/uL   Lymphocytes Relative 23 12 - 46 %   Lymphs Abs 1.8 0.7 - 4.0 K/uL   Monocytes Relative 7 3 - 12 %   Monocytes Absolute 0.6 0.1 - 1.0 K/uL   Eosinophils Relative 5 0 - 5 %   Eosinophils Absolute 0.4 0.0 - 0.7 K/uL   Basophils Relative 1 0 - 1 %   Basophils Absolute 0.1 0.0 - 0.1 K/uL  Comprehensive metabolic panel  Result Value Ref Range   Sodium 137 137 - 147 mEq/L   Potassium 4.4 3.7 - 5.3 mEq/L   Chloride 100 96 - 112 mEq/L   CO2 28 19 - 32 mEq/L   Glucose, Bld 99 70 - 99 mg/dL   BUN 26 (H) 6 - 23 mg/dL   Creatinine, Ser 6.64 (H) 0.50 - 1.35 mg/dL   Calcium 9.5 8.4 - 40.3 mg/dL   Total Protein 7.9 6.0 - 8.3 g/dL   Albumin 3.4 (L) 3.5 - 5.2 g/dL   AST 34 0 - 37 U/L   ALT 18 0 - 53 U/L   Alkaline Phosphatase 85 39 - 117 U/L   Total Bilirubin 0.8 0.3 - 1.2 mg/dL   GFR calc non Af Amer 40 (L) >90 mL/min   GFR calc Af Amer 46 (L)  >90 mL/min   Anion gap 9 5 - 15  Protime-INR  Result Value Ref Range   Prothrombin Time 14.6 11.6 - 15.2 seconds   INR 1.13 0.00 - 1.49  Urinalysis, Routine w reflex microscopic  Result Value Ref Range   Color, Urine YELLOW YELLOW   APPearance CLOUDY (A) CLEAR   Specific Gravity, Urine 1.017 1.005 - 1.030   pH 6.5 5.0 - 8.0   Glucose, UA NEGATIVE NEGATIVE mg/dL   Hgb urine dipstick NEGATIVE NEGATIVE   Bilirubin Urine NEGATIVE NEGATIVE   Ketones, ur NEGATIVE NEGATIVE mg/dL   Protein, ur NEGATIVE NEGATIVE mg/dL   Urobilinogen, UA 0.2 0.0 - 1.0 mg/dL   Nitrite NEGATIVE NEGATIVE   Leukocytes, UA NEGATIVE NEGATIVE    Labs Review Labs Reviewed  COMPREHENSIVE METABOLIC PANEL - Abnormal; Notable for the following:    BUN 26 (*)    Creatinine, Ser 1.51 (*)    Albumin 3.4 (*)    GFR calc non Af Amer 40 (*)    GFR calc Af Amer 46 (*)    All other components within normal limits  URINALYSIS, ROUTINE W REFLEX MICROSCOPIC - Abnormal; Notable for the following:    APPearance CLOUDY (*)    All other components within normal limits  TROPONIN I  CBC WITH DIFFERENTIAL  PROTIME-INR    Imaging Review Ct Head Wo Contrast  05/12/2014   CLINICAL DATA:  Initial evaluation for witnessed fall and hit head this morning at assisted living facility  EXAM: CT HEAD WITHOUT CONTRAST  CT CERVICAL SPINE WITHOUT CONTRAST  TECHNIQUE: Multidetector CT imaging of the head and cervical spine was performed following the standard protocol without intravenous contrast. Multiplanar CT image reconstructions of the cervical spine were also generated.  COMPARISON:  05/07/13  FINDINGS: CT HEAD FINDINGS  Calvarium is intact. There is no hemorrhage or extra-axial fluid. There is no infarct or mass. Diffuse atrophy and severe chronic ischemic change in white matter is stable. Encephalomalacia inferior right frontal lobe from prior infarct also unchanged.  CT CERVICAL SPINE FINDINGS  There is normal alignment. There is no  prevertebral  soft tissue swelling. There is no fracture. There is mild to moderate multilevel degenerative disc disease. The lung apices are clear. There are no acute soft tissue abnormalities. There is mild right carotid calcification. Thyroid gland is normal.  IMPRESSION: Encephalomalacia right frontal lobe with no acute intracranial abnormality.  No evidence of cervical spine fracture.   Electronically Signed   By: Esperanza Heiraymond  Rubner M.D.   On: 05/12/2014 10:52   Ct Cervical Spine Wo Contrast  05/12/2014   CLINICAL DATA:  Initial evaluation for witnessed fall and hit head this morning at assisted living facility  EXAM: CT HEAD WITHOUT CONTRAST  CT CERVICAL SPINE WITHOUT CONTRAST  TECHNIQUE: Multidetector CT imaging of the head and cervical spine was performed following the standard protocol without intravenous contrast. Multiplanar CT image reconstructions of the cervical spine were also generated.  COMPARISON:  05/07/13  FINDINGS: CT HEAD FINDINGS  Calvarium is intact. There is no hemorrhage or extra-axial fluid. There is no infarct or mass. Diffuse atrophy and severe chronic ischemic change in white matter is stable. Encephalomalacia inferior right frontal lobe from prior infarct also unchanged.  CT CERVICAL SPINE FINDINGS  There is normal alignment. There is no prevertebral soft tissue swelling. There is no fracture. There is mild to moderate multilevel degenerative disc disease. The lung apices are clear. There are no acute soft tissue abnormalities. There is mild right carotid calcification. Thyroid gland is normal.  IMPRESSION: Encephalomalacia right frontal lobe with no acute intracranial abnormality.  No evidence of cervical spine fracture.   Electronically Signed   By: Esperanza Heiraymond  Rubner M.D.   On: 05/12/2014 10:52     EKG Interpretation   Date/Time:  Thursday May 12 2014 09:04:40 EST Ventricular Rate:  67 PR Interval:    QRS Duration: 81 QT Interval:  620 QTC Calculation: 655 R Axis:    66 Text Interpretation:  Atrial fibrillation Abnrm T, consider ischemia,  anterolateral lds Prolonged QT interval Baseline wander Confirmed by  BEATON  MD, ROBERT ([id number]) on 05/12/2014 9:06:25 AM      9:44 AM This provider spoke with nurse taking care of patient. Spoke with Delos HaringShanna Shelton, RN. As per nurse reported that patient was walking with walker to breakfast when he lost his balance and fell backwards hitting his head on the edge of the table. Reported that patient did not have any signs of respiratory distress. Reported that he did not complain of chest pain, shortness of breath, difficulty breathing - no signs of respiratory distress. Reported that there was no signs of loss of consciousness. Stated that patient is at baseline.   MDM   Final diagnoses:  Fall, initial encounter  Head injury, initial encounter  Hematoma    Medications - No data to display  Filed Vitals:   05/12/14 0859  BP: 118/74  Pulse: 74  Temp: 97.7 F (36.5 C)  TempSrc: Oral  Resp: 20  SpO2: 96%   This provider reviewed the patient's chart. Patient has been seen and assessed in the ED setting a couple of times regarding falls. Last time seen regarding falls was in 2014.  EKG noted atrial fibrillation with prolonged QT interval with a heart rate of 67 bpm-rate controlled. Troponin negative elevation. CBC unremarkable. CMP noted elevated BUN of 26 and creatinine of 1.51- when compared to one year ago patient's BUN was 28 and creatinine was 2.04. INR and PTT within normal limits. Urinalysis negative for nitrites leukocytes-negative findings of infection. CT head noted encephalomalacia of the right frontal lobe  with no acute intracranial abnormalities. CT cervical spine negative for acute osseous injury. Patient presenting to the ED with what appears to be a mechanical fall. Nursing home denied any signs of respiratory distress prior to or after the fall and loss consciousness-reported the patient's mental status  baseline. Negative changes to mentation or behavior while in the ED setting. CT imaging negative for acute abnormalities. Negative focal neurological deficits noted. negative signs of seizure or ataxia. Discussed plan for discharge with attending physician who agrees to plan of care. Patient stable, afebrile. Patient not septic appearing. Discharged patient. Referred to PCP. Discussed with patient to rest and stay hydrated. Recommended that someone be with patient during ambulation and have to walk with assistance. Discussed with patient to closely monitor symptoms and if symptoms are to worsen or change to report back to the ED - strict return instructions given.  Patient agreed to plan of care, understood, all questions answered.   Raymon Mutton, PA-C 05/12/14 1126  Nelia Shi, MD 05/12/14 [phone]

## 2014-05-12 NOTE — Discharge Instructions (Signed)
Please call your doctor for a followup appointment within 24-48 hours. When you talk to your doctor please let them know that you were seen in the emergency department and have them acquire all of your records so that they can discuss the findings with you and formulate a treatment plan to fully care for your new and ongoing problems. Please call and set-up an appointment with your primary care provider Please rest and stay hydrated Highly recommend patient to be monitored when walking and for patient to use walker for ambulation - should be under close monitoring with ambulation Please continue to monitor symptoms closely and if symptoms are to worsen or change (fever greater than 101, chills, sweating, nausea, vomiting, chest pain, shortness of breathe, difficulty breathing, weakness, numbness, tingling, worsening or changes to pain pattern, fall, dizziness, headache, vision changes, neck pain, neck stiffness, loss of sensation) please report back to the Emergency Department immediately.    Fall Prevention and Home Safety Falls cause injuries and can affect all age groups. It is possible to use preventive measures to significantly decrease the likelihood of falls. There are many simple measures which can make your home safer and prevent falls. OUTDOORS  Repair cracks and edges of walkways and driveways.  Remove high doorway thresholds.  Trim shrubbery on the main path into your home.  Have good outside lighting.  Clear walkways of tools, rocks, debris, and clutter.  Check that handrails are not broken and are securely fastened. Both sides of steps should have handrails.  Have leaves, snow, and ice cleared regularly.  Use sand or salt on walkways during winter months.  In the garage, clean up grease or oil spills. BATHROOM  Install night lights.  Install grab bars by the toilet and in the tub and shower.  Use non-skid mats or decals in the tub or shower.  Place a plastic non-slip  stool in the shower to sit on, if needed.  Keep floors dry and clean up all water on the floor immediately.  Remove soap buildup in the tub or shower on a regular basis.  Secure bath mats with non-slip, double-sided rug tape.  Remove throw rugs and tripping hazards from the floors. BEDROOMS  Install night lights.  Make sure a bedside light is easy to reach.  Do not use oversized bedding.  Keep a telephone by your bedside.  Have a firm chair with side arms to use for getting dressed.  Remove throw rugs and tripping hazards from the floor. KITCHEN  Keep handles on pots and pans turned toward the center of the stove. Use back burners when possible.  Clean up spills quickly and allow time for drying.  Avoid walking on wet floors.  Avoid hot utensils and knives.  Position shelves so they are not too high or low.  Place commonly used objects within easy reach.  If necessary, use a sturdy step stool with a grab bar when reaching.  Keep electrical cables out of the way.  Do not use floor polish or wax that makes floors slippery. If you must use wax, use non-skid floor wax.  Remove throw rugs and tripping hazards from the floor. STAIRWAYS  Never leave objects on stairs.  Place handrails on both sides of stairways and use them. Fix any loose handrails. Make sure handrails on both sides of the stairways are as long as the stairs.  Check carpeting to make sure it is firmly attached along stairs. Make repairs to worn or loose carpet promptly.  Avoid placing throw rugs at the top or bottom of stairways, or properly secure the rug with carpet tape to prevent slippage. Get rid of throw rugs, if possible.  Have an electrician put in a light switch at the top and bottom of the stairs. OTHER FALL PREVENTION TIPS  Wear low-heel or rubber-soled shoes that are supportive and fit well. Wear closed toe shoes.  When using a stepladder, make sure it is fully opened and both spreaders  are firmly locked. Do not climb a closed stepladder.  Add color or contrast paint or tape to grab bars and handrails in your home. Place contrasting color strips on first and last steps.  Learn and use mobility aids as needed. Install an electrical emergency response system.  Turn on lights to avoid dark areas. Replace light bulbs that burn out immediately. Get light switches that glow.  Arrange furniture to create clear pathways. Keep furniture in the same place.  Firmly attach carpet with non-skid or double-sided tape.  Eliminate uneven floor surfaces.  Select a carpet pattern that does not visually hide the edge of steps.  Be aware of all pets. OTHER HOME SAFETY TIPS  Set the water temperature for 120 F (48.8 C).  Keep emergency numbers on or near the telephone.  Keep smoke detectors on every level of the home and near sleeping areas. Document Released: 05/24/2002 Document Revised: 12/03/2011 Document Reviewed: 08/23/2011 [facility] Patient Information 2015 [place], [place]. This information is not intended to replace advice given to you by your health care provider. Make sure you discuss any questions you have with your health care provider.  Hematoma A hematoma is a collection of blood under the skin, in an organ, in a body space, in a joint space, or in other tissue. The blood can clot to form a lump that you can see and feel. The lump is often firm and may sometimes become sore and tender. Most hematomas get better in a few days to weeks. However, some hematomas may be serious and require medical care. Hematomas can range in size from very small to very large. CAUSES  A hematoma can be caused by a blunt or penetrating injury. It can also be caused by spontaneous leakage from a blood vessel under the skin. Spontaneous leakage from a blood vessel is more likely to occur in older people, especially those taking blood thinners. Sometimes, a hematoma can develop after certain medical  procedures. SIGNS AND SYMPTOMS   A firm lump on the body.  Possible pain and tenderness in the area.  Bruising.Blue, dark blue, purple-red, or yellowish skin may appear at the site of the hematoma if the hematoma is close to the surface of the skin. For hematomas in deeper tissues or body spaces, the signs and symptoms may be subtle. For example, an intra-abdominal hematoma may cause abdominal pain, weakness, fainting, and shortness of breath. An intracranial hematoma may cause a headache or symptoms such as weakness, trouble speaking, or a change in consciousness. DIAGNOSIS  A hematoma can usually be diagnosed based on your medical history and a physical exam. Imaging tests may be needed if your health care provider suspects a hematoma in deeper tissues or body spaces, such as the abdomen, head, or chest. These tests may include ultrasonography or a CT scan.  TREATMENT  Hematomas usually go away on their own over time. Rarely does the blood need to be drained out of the body. Large hematomas or those that may affect vital organs will sometimes  need surgical drainage or monitoring. HOME CARE INSTRUCTIONS   Apply ice to the injured area:   Put ice in a plastic bag.   Place a towel between your skin and the bag.   Leave the ice on for 20 minutes, 2-3 times a day for the first 1 to 2 days.   After the first 2 days, switch to using warm compresses on the hematoma.   Elevate the injured area to help decrease pain and swelling. Wrapping the area with an elastic bandage may also be helpful. Compression helps to reduce swelling and promotes shrinking of the hematoma. Make sure the bandage is not wrapped too tight.   If your hematoma is on a lower extremity and is painful, crutches may be helpful for a couple days.   Only take over-the-counter or prescription medicines as directed by your health care provider. SEEK IMMEDIATE MEDICAL CARE IF:   You have increasing pain, or your pain is  not controlled with medicine.   You have a fever.   You have worsening swelling or discoloration.   Your skin over the hematoma breaks or starts bleeding.   Your hematoma is in your chest or abdomen and you have weakness, shortness of breath, or a change in consciousness.  Your hematoma is on your scalp (caused by a fall or injury) and you have a worsening headache or a change in alertness or consciousness. MAKE SURE YOU:   Understand these instructions.  Will watch your condition.  Will get help right away if you are not doing well or get worse. Document Released: 01/16/2004 Document Revised: 02/03/2013 Document Reviewed: 11/11/2012 [facility] Patient Information 2015 [place] RockExitCare, [place]LLC. This information is not intended to replace advice given to you by your health care provider. Make sure you discuss any questions you have with your health care provider.   Emergency Department Resource Guide 1) Find a Doctor and Pay Out of Pocket Although you won't have to find out who is covered by your insurance plan, it is a good idea to ask around and get recommendations. You will then need to call the office and see if the doctor you have chosen will accept you as a new patient and what types of options they offer for patients who are self-pay. Some doctors offer discounts or will set up payment plans for their patients who do not have insurance, but you will need to ask so you aren't surprised when you get to your appointment.  2) Contact Your Local Health Department Not all health departments have doctors that can see patients for sick visits, but many do, so it is worth a call to see if yours does. If you don't know where your local health department is, you can check in your phone book. The CDC also has a tool to help you locate your state's health department, and many state websites also have listings of all of their local health departments.  3) Find a Walk-in Clinic If your illness is not  likely to be very severe or complicated, you may want to try a walk in clinic. These are popping up all over the country in pharmacies, drugstores, and shopping centers. They're usually staffed by nurse practitioners or physician assistants that have been trained to treat common illnesses and complaints. They're usually fairly quick and inexpensive. However, if you have serious medical issues or chronic medical problems, these are probably not your best option.  No Primary Care Doctor: - Call Health Connect at  [phone] -  they can help you locate a primary care doctor that  accepts your insurance, provides certain services, etc. - Physician Referral Service- [phone]  Chronic Pain Problems: Organization         Address  Phone   Notes  [facility]  [phone] Patients need to be referred by their primary care doctor.   Medication Assistance: Organization         Address  Phone   Notes  [facility] Medication [facility] [address] [place] [place], [place] [address] [phone] --Must be a resident of [facility] -- Must have NO insurance coverage whatsoever (no Medicaid/ Medicare, etc.) -- The pt. MUST have a primary care doctor that directs their care regularly and follows them in the community   [facility]ist  [phone]   [employer]  [phone]    Agencies that provide inexpensive medical care: Organization         Address  Phone   Notes  [facility]  [phone]   [facility]    [phone]   [facility] [address] [place], [place] [address] [phone]   [facility] [address] [place], [place] [phone]   Planned Parenthood    [phone]   [facility]    [phone]   Community [facility] and [facility]  [address], [place] Phone:  [phone], Fax:  [phone] Hours of Operation:  9 am - 6 pm,  M-F.  Also accepts Medicaid/Medicare and self-pay.  [facility]  [address], [place] Phone: [phone], Fax: [phone]. Hours of Operation:  8:30 am - 5:30 pm, M-F.  Also accepts Medicaid and self-pay.  [facility] [place] [address], [place] Phone: [phone]   [facility] [address] Natasha Bence [place], [place] [phone], Ext. [phone] Mondays & Thursdays: 7-9 AM.  First 15 patients are seen on a first come, first serve basis.    Medicaid-accepting [facility] Providers:  Organization         Address  Phone   Notes  [facility] [address],  [phone] Also accepts self-pay patients.  [facility] [address] Laurell Josephs [place], [place]  [phone]   [facility] [address], [place] [phone]   [facility] [address] [place] [phone]   Renaye Rakers [address], [place]   [phone] Only accepts [place] Access [place] patients after they have their name applied to their card.   Self-Pay (no insurance) in [facility]:  Organization         Address  Phone   Notes  Sickle Cell Patients, [facility] [address] [place], [place] [phone]   [facility] [address] [place], [place] [phone]   [facility] [place]  [address], [place] [phone]   [facility]/Dr. Osei-Bonsu  [address] [place] or [address], [place] [phone] Phone number for both [place] and [place] locations is the same.  [facility]gent [facility] and [facility] [address] [place] [phone]   [facility] [address], [place] or [address] [phone]   [facility] [address] [place] [phone], phone; [phone], fax Sees patients 1st and 3rd Saturday of every month.  Must not qualify for public or private insurance (i.e. Medicaid, Medicare, [place] Health Choice, Veterans' Benefits)  Household income should be no more than 200% of the poverty level The clinic cannot treat you if you are pregnant or think you are pregnant  Sexually transmitted diseases are not treated at the clinic.    Dental Care: Organization         Address  Phone  Notes  [facility] of [facility] [address] [place], [place] [phone] Accepts children up to age 42 who are enrolled in [place] or [place] Health Choice; pregnant women with a Medicaid card; and children who have applied for Medicaid or [place] Health Choice, but were declined, whose parents can pay a reduced fee at time of service.  [facility] of [facility]  [address], [place] [phone] Accepts children up to age 53 who are enrolled in [place] or [place] Health Choice; pregnant women with a Medicaid card; and children who have applied for Medicaid or [place] Health Choice, but were declined, whose parents can pay a reduced fee at time of service.  [facility]  [address] [place], [place] [phone] Patients are seen by appointment only. Walk-ins are not accepted. [facility] will see patients 41 years of age and older. Monday - Tuesday (8am-5pm) Most Wednesdays (8:30-5pm) $30 per visit, cash only  [facility] Adult Dental Access PRO[facility]  [address], [facility] [phone] Patients are seen by appointment only. Walk-ins are not accepted. [facility] will see patients 3 years of age and older. One Wednesday Evening (Monthly: Volunteer Based).  $30 per visit, cash only  [employer]  [phone] for adults; Children under age 48, call Graduate Pediatric Dentistry at [phone]. Children aged 60-14, please call [phone] to request a pediatric application.  Dental services are provided in all areas of dental care including fillings, crowns and bridges, complete and partial dentures, implants, gum treatment, root canals, and extractions. Preventive care is also provided. Treatment is provided to both adults and children. Patients are selected via a lottery and there is often a waiting list.   [facility] [address] [place]  [phone] [email or web address]   [facility] [address] [place] [address] [place], [place] [phone], Ext. [phone] Second and Fourth Thursday of each month, opens at 6:30 AM; Clinic ends at 9 AM.  Patients are seen on a first-come first-served basis, and a limited number are seen during each clinic.   [facility]  [address] Ether Griffins [place], [place] [phone]   Eligibility Requirements You must have lived in [place], [place], or [place] counties for at least the last three months.   You cannot be eligible for state or federal sponsored [place], including [employer], [place], or [employer].   You generally cannot be eligible for healthcare insurance through your employer.    How to apply: Eligibility screenings are held every Tuesday and Wednesday afternoon from 1:00 pm until 4:00 pm. You do not need an appointment for the interview!  [facility] [address] [place], [place] [phone]   [facility] Health Department  [phone]   [facility]  Department  [phone]   [facility] Health Department  [phone]    Behavioral Health Resources in the Community: Intensive Outpatient Programs Organization         Address  Phone  Notes  [facility] Services [address] [place], [place] [phone]   [facility] [address] [place], [place] [phone]   [facility]  [address] [place], [place]  [phone]   [facility] Mental Health [address]  [place], [place] [phone] or [phone]   Substance Abuse Resources Organization         Address  Phone  Notes  [facility]  [phone]   [facility]  [phone]   [facility] [place]  [phone]   Floydene Flock  [phone]   Residential & Outpatient Substance Abuse Program  [phone]   Psychological Services Organization         Address  Phone  Notes  [facility] Behavioral Health  [phone]   [facility] Services  [phone]   [facility] [address] [place] [phone] or [phone]    Mobile Crisis Teams Organization         Address  Phone  Notes  [facility], Mobile Crisis Care Unit  [phone]   [facility]  [address] [place], [place] [phone]   Doristine Locks [address] [place] [phone]    Self-Help/Support Groups Organization         Address  Phone             Notes  [facility] [place] - variety of support groups  [phone]- I7437963 Call for more information  Narcotics Anonymous (NA), [facility] [address], [employer] [place]  2 meetings at this location   Statistician         Address  Phone  Notes  [facility] [address],    [place]  [phone]   [facility]  [address], [place] [address], [place], [place] [phone]   [facility] [address] [place], [place] [phone] Admissions: 8am-3pm M-F  [facility] [address]    [place], [place] [phone]   [facility] [address] [place], [place], [place] [phone]   [facility] [address],  [place], [place] [phone]   [facility] - Intensive Outpatient [address] Laurell Josephs [address], [place], [place]  [phone]   [facility] (Addiction Recovery [facility] [address] [place]  [place], [place] [phone] or [phone]   [facility] ([facility]) [address], [place], [place] [phone] Accepts Medicaid  [facility] [address],  [place] [phone] Substance Abuse/Addiction Treatment   [facility] Organization         Address  Phone  Notes  [facility]  [phone]   Angie Fava, PhD [address] Ervin Knack [place], [place]   [phone] or [phone]   [facility] Behavioral   [address] [place], [place] [phone]   [facility] [address], [place], [place] [phone] Insurance/Medicaid/sponsorship through [employer] and Families [address]  [place], [place] [phone] [place], [place] [phone]    Dr. Adele Schilder  [phone]   [facility] Dept. 1) [address] [place] 2) [place] 3)  [address], [place] [phone]  [phone]   [place] [phone] or [phone] (After Hours)

## 2014-07-20 ENCOUNTER — Emergency Department (HOSPITAL_COMMUNITY): Payer: Medicare HMO

## 2014-07-20 ENCOUNTER — Inpatient Hospital Stay (HOSPITAL_COMMUNITY)
Admission: EM | Admit: 2014-07-20 | Discharge: 2014-07-28 | DRG: 853 | Disposition: A | Discharge status: Skilled Nursing Facility | Payer: Medicare HMO | Attending: Internal Medicine | Admitting: Internal Medicine

## 2014-07-20 ENCOUNTER — Encounter (HOSPITAL_COMMUNITY): Payer: Self-pay

## 2014-07-20 DIAGNOSIS — N400 Enlarged prostate without lower urinary tract symptoms: Secondary | ICD-10-CM | POA: Diagnosis present

## 2014-07-20 DIAGNOSIS — R531 Weakness: Secondary | ICD-10-CM | POA: Diagnosis present

## 2014-07-20 DIAGNOSIS — G629 Polyneuropathy, unspecified: Secondary | ICD-10-CM | POA: Insufficient documentation

## 2014-07-20 DIAGNOSIS — Z79899 Other long term (current) drug therapy: Secondary | ICD-10-CM | POA: Diagnosis not present

## 2014-07-20 DIAGNOSIS — N189 Chronic kidney disease, unspecified: Secondary | ICD-10-CM | POA: Diagnosis present

## 2014-07-20 DIAGNOSIS — N183 Chronic kidney disease, stage 3 (moderate): Secondary | ICD-10-CM | POA: Diagnosis present

## 2014-07-20 DIAGNOSIS — Z8673 Personal history of transient ischemic attack (TIA), and cerebral infarction without residual deficits: Secondary | ICD-10-CM

## 2014-07-20 DIAGNOSIS — L0390 Cellulitis, unspecified: Secondary | ICD-10-CM | POA: Diagnosis present

## 2014-07-20 DIAGNOSIS — I340 Nonrheumatic mitral (valve) insufficiency: Secondary | ICD-10-CM | POA: Diagnosis present

## 2014-07-20 DIAGNOSIS — Z66 Do not resuscitate: Secondary | ICD-10-CM | POA: Diagnosis present

## 2014-07-20 DIAGNOSIS — L8990 Pressure ulcer of unspecified site, unspecified stage: Secondary | ICD-10-CM

## 2014-07-20 DIAGNOSIS — I351 Nonrheumatic aortic (valve) insufficiency: Secondary | ICD-10-CM | POA: Diagnosis present

## 2014-07-20 DIAGNOSIS — G309 Alzheimer's disease, unspecified: Secondary | ICD-10-CM | POA: Diagnosis present

## 2014-07-20 DIAGNOSIS — N1330 Unspecified hydronephrosis: Secondary | ICD-10-CM | POA: Diagnosis present

## 2014-07-20 DIAGNOSIS — L03319 Cellulitis of trunk, unspecified: Secondary | ICD-10-CM

## 2014-07-20 DIAGNOSIS — L89159 Pressure ulcer of sacral region, unspecified stage: Secondary | ICD-10-CM | POA: Diagnosis present

## 2014-07-20 DIAGNOSIS — R509 Fever, unspecified: Secondary | ICD-10-CM

## 2014-07-20 DIAGNOSIS — N179 Acute kidney failure, unspecified: Secondary | ICD-10-CM | POA: Diagnosis present

## 2014-07-20 DIAGNOSIS — E876 Hypokalemia: Secondary | ICD-10-CM | POA: Diagnosis present

## 2014-07-20 DIAGNOSIS — I10 Essential (primary) hypertension: Secondary | ICD-10-CM | POA: Diagnosis present

## 2014-07-20 DIAGNOSIS — F0280 Dementia in other diseases classified elsewhere without behavioral disturbance: Secondary | ICD-10-CM | POA: Diagnosis present

## 2014-07-20 DIAGNOSIS — E861 Hypovolemia: Secondary | ICD-10-CM | POA: Diagnosis present

## 2014-07-20 DIAGNOSIS — L89153 Pressure ulcer of sacral region, stage 3: Secondary | ICD-10-CM | POA: Diagnosis present

## 2014-07-20 DIAGNOSIS — I482 Chronic atrial fibrillation, unspecified: Secondary | ICD-10-CM | POA: Diagnosis present

## 2014-07-20 DIAGNOSIS — D510 Vitamin B12 deficiency anemia due to intrinsic factor deficiency: Secondary | ICD-10-CM | POA: Insufficient documentation

## 2014-07-20 DIAGNOSIS — R6520 Severe sepsis without septic shock: Secondary | ICD-10-CM | POA: Diagnosis present

## 2014-07-20 DIAGNOSIS — A4102 Sepsis due to Methicillin resistant Staphylococcus aureus: Secondary | ICD-10-CM | POA: Diagnosis present

## 2014-07-20 DIAGNOSIS — Z7982 Long term (current) use of aspirin: Secondary | ICD-10-CM

## 2014-07-20 DIAGNOSIS — F0390 Unspecified dementia without behavioral disturbance: Secondary | ICD-10-CM | POA: Diagnosis present

## 2014-07-20 DIAGNOSIS — L8993 Pressure ulcer of unspecified site, stage 3: Secondary | ICD-10-CM

## 2014-07-20 DIAGNOSIS — I739 Peripheral vascular disease, unspecified: Secondary | ICD-10-CM | POA: Insufficient documentation

## 2014-07-20 DIAGNOSIS — L089 Local infection of the skin and subcutaneous tissue, unspecified: Secondary | ICD-10-CM | POA: Diagnosis present

## 2014-07-20 DIAGNOSIS — I129 Hypertensive chronic kidney disease with stage 1 through stage 4 chronic kidney disease, or unspecified chronic kidney disease: Secondary | ICD-10-CM | POA: Diagnosis present

## 2014-07-20 DIAGNOSIS — A419 Sepsis, unspecified organism: Secondary | ICD-10-CM

## 2014-07-20 DIAGNOSIS — S31000A Unspecified open wound of lower back and pelvis without penetration into retroperitoneum, initial encounter: Secondary | ICD-10-CM

## 2014-07-20 HISTORY — DX: Polyneuropathy, unspecified: G62.9

## 2014-07-20 HISTORY — DX: Peripheral vascular disease, unspecified: I73.9

## 2014-07-20 HISTORY — DX: Vitamin B12 deficiency anemia due to intrinsic factor deficiency: D51.0

## 2014-07-20 LAB — CBC WITH DIFFERENTIAL/PLATELET
BASOS PCT: 0 % (ref 0–1)
Basophils Absolute: 0 10*3/uL (ref 0.0–0.1)
Eosinophils Absolute: 0 10*3/uL (ref 0.0–0.7)
Eosinophils Relative: 0 % (ref 0–5)
HCT: 42.2 % (ref 39.0–52.0)
Hemoglobin: 13.7 g/dL (ref 13.0–17.0)
Lymphocytes Relative: 3 % — ABNORMAL LOW (ref 12–46)
Lymphs Abs: 0.8 10*3/uL (ref 0.7–4.0)
MCH: 31.2 pg (ref 26.0–34.0)
MCHC: 32.5 g/dL (ref 30.0–36.0)
MCV: 96.1 fL (ref 78.0–100.0)
Monocytes Absolute: 2 10*3/uL — ABNORMAL HIGH (ref 0.1–1.0)
Monocytes Relative: 7 % (ref 3–12)
Neutro Abs: 25.5 10*3/uL — ABNORMAL HIGH (ref 1.7–7.7)
Neutrophils Relative %: 90 % — ABNORMAL HIGH (ref 43–77)
PLATELETS: 203 10*3/uL (ref 150–400)
RBC: 4.39 MIL/uL (ref 4.22–5.81)
RDW: 14.7 % (ref 11.5–15.5)
WBC: 28.3 10*3/uL — ABNORMAL HIGH (ref 4.0–10.5)

## 2014-07-20 LAB — URINALYSIS, ROUTINE W REFLEX MICROSCOPIC
Bilirubin Urine: NEGATIVE
Glucose, UA: NEGATIVE mg/dL
Ketones, ur: NEGATIVE mg/dL
Leukocytes, UA: NEGATIVE
Nitrite: NEGATIVE
Protein, ur: 30 mg/dL — AB
SPECIFIC GRAVITY, URINE: 1.019 (ref 1.005–1.030)
Urobilinogen, UA: 0.2 mg/dL (ref 0.0–1.0)
pH: 5.5 (ref 5.0–8.0)

## 2014-07-20 LAB — COMPREHENSIVE METABOLIC PANEL
ALT: 32 U/L (ref 0–53)
ANION GAP: 13 (ref 5–15)
AST: 73 U/L — AB (ref 0–37)
Albumin: 3.4 g/dL — ABNORMAL LOW (ref 3.5–5.2)
Alkaline Phosphatase: 101 U/L (ref 39–117)
BUN: 67 mg/dL — ABNORMAL HIGH (ref 6–23)
CO2: 24 mmol/L (ref 19–32)
CREATININE: 2.63 mg/dL — AB (ref 0.50–1.35)
Calcium: 9.4 mg/dL (ref 8.4–10.5)
Chloride: 98 mmol/L (ref 96–112)
GFR, EST AFRICAN AMERICAN: 24 mL/min — AB (ref >90)
GFR, EST NON AFRICAN AMERICAN: 20 mL/min — AB (ref >90)
Glucose, Bld: 135 mg/dL — ABNORMAL HIGH (ref 70–99)
Potassium: 3.7 mmol/L (ref 3.5–5.1)
Sodium: 135 mmol/L (ref 135–145)
Total Bilirubin: 1.1 mg/dL (ref 0.3–1.2)
Total Protein: 8 g/dL (ref 6.0–8.3)

## 2014-07-20 LAB — URINE MICROSCOPIC-ADD ON

## 2014-07-20 LAB — I-STAT CG4 LACTIC ACID, ED
Lactic Acid, Venous: 2.65 mmol/L (ref 0.5–2.0)
Lactic Acid, Venous: 1.93 mmol/L (ref 0.5–2.0)

## 2014-07-20 MED ORDER — SODIUM CHLORIDE 0.9 % IV BOLUS (SEPSIS)
1000.0000 mL | INTRAVENOUS | Status: AC
  Administered 2014-07-20 (×2): 1000 mL via INTRAVENOUS

## 2014-07-20 MED ORDER — SODIUM CHLORIDE 0.9 % IV BOLUS (SEPSIS)
500.0000 mL | INTRAVENOUS | Status: AC
  Administered 2014-07-20: 500 mL via INTRAVENOUS

## 2014-07-20 MED ORDER — DEXTROSE 5 % IV SOLN
1.0000 g | Freq: Once | INTRAVENOUS | Status: AC
  Administered 2014-07-20: 1 g via INTRAVENOUS
  Filled 2014-07-20: qty 10

## 2014-07-20 MED ORDER — VANCOMYCIN HCL IN DEXTROSE 1-5 GM/200ML-% IV SOLN
1000.0000 mg | Freq: Once | INTRAVENOUS | Status: AC
  Administered 2014-07-20: 1000 mg via INTRAVENOUS
  Filled 2014-07-20: qty 200

## 2014-07-20 NOTE — ED Notes (Signed)
[facility] EMS re:  Pt. Not as ambulatory and weaker than usual for past few days.  He arrives awake and in no distress.  He arrives having been incontinent of stool.

## 2014-07-20 NOTE — ED Notes (Signed)
Pt clothed soiled, pt arrived on ems from nursing home this way, pt cleaned and clothes put in pt belonging bag. Bed sore on near sacrum visualized nurse informed

## 2014-07-20 NOTE — ED Notes (Signed)
Bed: [id number] Expected date:  Expected time:  Means of arrival:  Comments: Fall from nursing home

## 2014-07-20 NOTE — ED Notes (Signed)
Report received from previous RN, Tim. 

## 2014-07-20 NOTE — H&P (Signed)
[facility] History and Physical  [name] SHF:[id number] DOB: 06-11-1925 DOA: 07/20/2014   PCP: Unknown   Specialists: Unknown  Chief Complaint: Weakness  HPI: Darryl Summers is a 81 y.o. male with a past medical history of Alzheimer's dementia, which appears to be moderate to advanced, atrial fibrillation not on anticoagulation, hypertension, who lives in a skilled nursing facility in their memory unit. Apparently for the past few days patient has not been ambulating and has been weaker than normal. This is as per nursing reports. Patient is pleasantly confused and unable to provide any history. In the emergency department he was found to have stage 2 to 3 sacral decubitus ulcer. He was found to have leukocytosis and he was febrile. Evaluation reveals that he likely has infected sacral decubitus.  Home Medications: Prior to Admission medications   Medication Sig Start Date End Date Taking? Authorizing Provider  acetaminophen (TYLENOL) 325 MG tablet Take 650 mg by mouth every 6 (six) hours as needed for mild pain.   Yes Historical Provider, MD  aspirin 325 MG tablet Take 325 mg by mouth daily.   Yes Historical Provider, MD  divalproex (DEPAKOTE) 125 MG DR tablet Take 125 mg by mouth 2 (two) times daily.   Yes Historical Provider, MD  donepezil (ARICEPT) 10 MG tablet Take 10 mg by mouth at bedtime.   Yes Historical Provider, MD  feeding supplement (ENSURE IMMUNE HEALTH) LIQD Take 237 mLs by mouth daily.   Yes Historical Provider, MD  loperamide (IMODIUM) 2 MG capsule Take 2-4 mg by mouth as needed for diarrhea or loose stools.   Yes Historical Provider, MD  LORazepam (ATIVAN) 0.5 MG tablet Take 0.5 mg by mouth every 8 (eight) hours as needed for anxiety or sleep.   Yes Historical Provider, MD  memantine (NAMENDA) 10 MG tablet Take 10 mg by mouth 2 (two) times daily.   Yes Historical Provider, MD  NYSTATIN EX Apply 1 application topically 3 (three) times daily. Apply to  buttocks until healed   Yes Historical Provider, MD    Allergies: No Known Allergies  Past Medical History: Past Medical History  Diagnosis Date  . Atrial fibrillation   . History of stroke   . Hypertension   . Aortic insufficiency   . Moderate mitral regurgitation   . Stroke   . Pernicious anemia   . Peripheral vascular disease   . Peripheral neuropathy     History reviewed. No pertinent past surgical history.  Social History: Lives in a skilled nursing facility. Other aspects of the social history are unobtainable due to his dementia.  Family History:  unable to obtain due to dementia  Review of Systems - unable to obtain due to dementia  Physical Examination  Filed Vitals:   07/20/14 2100 07/20/14 2130 07/20/14 2200 07/20/14 2230  BP: 134/52 122/55 124/58 136/63  Pulse: 97 97 97 92  Temp:      TempSrc:      Resp: 19 20 20 19   Height:      Weight:      SpO2: 98% 98% 97% 96%    BP 136/63 mmHg  Pulse 92  Temp(Src) 100.4 F (38 C) (Rectal)  Resp 19  Ht 5' 10"  (1.778 m)  Wt 79.379 kg (175 lb)  BMI 25.11 kg/m2  SpO2 96%  General appearance: alert, distracted and no distress Head: Normocephalic, without obvious abnormality, atraumatic Eyes: conjunctivae/corneas clear. PERRL, EOM's intact.  Neck: no adenopathy, no carotid bruit, no JVD, supple, symmetrical,  trachea midline and thyroid not enlarged, symmetric, no tenderness/mass/nodules Back: He has a home in ulcer in the buttock area towards the right side. Some erythema is noted. Some yellowish drainage is present. Excoriation of his scrotum is also noted. Resp: clear to auscultation bilaterally Cardio: irregularly irregular rhythm, slightly tachycardic. no click and no rub. No murmurs GI: soft, non-tender; bowel sounds normal; no masses,  no organomegaly Male genitalia: Excoriation of his scrotum. No active drainage. Extremities: extremities normal, atraumatic, no cyanosis or edema Pulses: 2+ and  symmetric Skin: Decubitus ulcer as noted above Neurologic: Alert. Confused and disoriented. Moving all his extremities. No obvious focal neurological deficits.  Laboratory Data: Results for orders placed or performed during the hospital encounter of 07/20/14 (from the past 48 hour(s))  CBC WITH DIFFERENTIAL     Status: Abnormal   Collection Time: 07/20/14  5:13 PM  Result Value Ref Range   WBC 28.3 (H) 4.0 - 10.5 K/uL   RBC 4.39 4.22 - 5.81 MIL/uL   Hemoglobin 13.7 13.0 - 17.0 g/dL   HCT 42.2 39.0 - 52.0 %   MCV 96.1 78.0 - 100.0 fL   MCH 31.2 26.0 - 34.0 pg   MCHC 32.5 30.0 - 36.0 g/dL   RDW 14.7 11.5 - 15.5 %   Platelets 203 150 - 400 K/uL   Neutrophils Relative % 90 (H) 43 - 77 %   Lymphocytes Relative 3 (L) 12 - 46 %   Monocytes Relative 7 3 - 12 %   Eosinophils Relative 0 0 - 5 %   Basophils Relative 0 0 - 1 %   Neutro Abs 25.5 (H) 1.7 - 7.7 K/uL   Lymphs Abs 0.8 0.7 - 4.0 K/uL   Monocytes Absolute 2.0 (H) 0.1 - 1.0 K/uL   Eosinophils Absolute 0.0 0.0 - 0.7 K/uL   Basophils Absolute 0.0 0.0 - 0.1 K/uL   WBC Morphology MILD LEFT SHIFT (1-5% METAS, OCC MYELO, OCC BANDS)     Comment: VACUOLATED NEUTROPHILS  Comprehensive metabolic panel     Status: Abnormal   Collection Time: 07/20/14  5:13 PM  Result Value Ref Range   Sodium 135 135 - 145 mmol/L   Potassium 3.7 3.5 - 5.1 mmol/L   Chloride 98 96 - 112 mmol/L   CO2 24 19 - 32 mmol/L   Glucose, Bld 135 (H) 70 - 99 mg/dL   BUN 67 (H) 6 - 23 mg/dL   Creatinine, Ser 2.63 (H) 0.50 - 1.35 mg/dL   Calcium 9.4 8.4 - 10.5 mg/dL   Total Protein 8.0 6.0 - 8.3 g/dL   Albumin 3.4 (L) 3.5 - 5.2 g/dL   AST 73 (H) 0 - 37 U/L   ALT 32 0 - 53 U/L   Alkaline Phosphatase 101 39 - 117 U/L   Total Bilirubin 1.1 0.3 - 1.2 mg/dL   GFR calc non Af Amer 20 (L) >90 mL/min   GFR calc Af Amer 24 (L) >90 mL/min    Comment: (NOTE) The eGFR has been calculated using the CKD EPI equation. This calculation has not been validated in all clinical  situations. eGFR's persistently <90 mL/min signify possible Chronic Kidney Disease.    Anion gap 13 5 - 15  I-Stat CG4 Lactic Acid, ED (not at [facility])     Status: Abnormal   Collection Time: 07/20/14  5:19 PM  Result Value Ref Range   Lactic Acid, Venous 2.65 (HH) 0.5 - 2.0 mmol/L   Comment NOTIFIED PHYSICIAN   Urinalysis, Routine  w reflex microscopic     Status: Abnormal   Collection Time: 07/20/14  6:16 PM  Result Value Ref Range   Color, Urine YELLOW YELLOW   APPearance CLEAR CLEAR   Specific Gravity, Urine 1.019 1.005 - 1.030   pH 5.5 5.0 - 8.0   Glucose, UA NEGATIVE NEGATIVE mg/dL   Hgb urine dipstick MODERATE (A) NEGATIVE   Bilirubin Urine NEGATIVE NEGATIVE   Ketones, ur NEGATIVE NEGATIVE mg/dL   Protein, ur 30 (A) NEGATIVE mg/dL   Urobilinogen, UA 0.2 0.0 - 1.0 mg/dL   Nitrite NEGATIVE NEGATIVE   Leukocytes, UA NEGATIVE NEGATIVE  Urine microscopic-add on     Status: None   Collection Time: 07/20/14  6:16 PM  Result Value Ref Range   Squamous Epithelial / LPF RARE RARE   WBC, UA 0-2 <3 WBC/hpf   RBC / HPF 0-2 <3 RBC/hpf   Bacteria, UA RARE RARE  I-Stat CG4 Lactic Acid, ED (not at [facility])     Status: None   Collection Time: 07/20/14  7:56 PM  Result Value Ref Range   Lactic Acid, Venous 1.93 0.5 - 2.0 mmol/L    Radiology Reports: Ct Abdomen Pelvis Wo Contrast  07/20/2014   CLINICAL DATA:  Sacral decubitus ulceration. Worsening generalized weakness and incontinence. Initial encounter.  EXAM: CT ABDOMEN AND PELVIS WITHOUT CONTRAST  TECHNIQUE: Multidetector CT imaging of the abdomen and pelvis was performed following the standard protocol without IV contrast.  COMPARISON:  CT of the abdomen and pelvis performed 05/24/2002, and renal ultrasound performed 07/06/2007  FINDINGS: Honeycombing and scarring are noted at the visualized lung bases.  A linear 3.5 cm hypodensity within the inferior right hepatic lobe may reflect remote injury or a cyst, increased in size from 2003. Previously  noted hepatic cysts appear to have resolved. The spleen is unremarkable. Stones are noted dependently within the gallbladder. The gallbladder is otherwise unremarkable. The pancreas and adrenal glands are unremarkable.  Mild bilateral hydronephrosis is noted. This is thought to reflect marked distention of the bladder, without evidence of a distal obstructing stone. Multiple large right renal cysts are seen, measuring up to 9.1 cm in size. Nonspecific perinephric stranding is noted bilaterally. No nonobstructing renal stones are identified.  No free fluid is identified. The small bowel is unremarkable in appearance. The stomach is within normal limits. No acute vascular abnormalities are seen. Scattered calcification is noted along the abdominal aorta and its branches.  The appendix is normal in caliber, without evidence for appendicitis. Scattered diverticulosis is noted along the proximal sigmoid colon, and a single diverticulum is noted at the proximal descending colon, without evidence of diverticulitis.  The bladder is significantly distended. The prostate is mildly enlarged, measuring 5.1 cm in transverse dimension, with scattered calcification. No inguinal lymphadenopathy is seen.  There is diffuse soft tissue inflammation about the anorectal canal, slightly more prominent on the right, with a likely associated ulceration along the overlying right-sided perineal soft tissues. Minimal air is noted within the right-sided soft tissues, likely reflect an underlying sinus tract, without a defined abscess. There is mild wall thickening along the distal rectum.  No acute osseous abnormalities are identified. Multilevel vacuum phenomenon is noted at the lower lumbar spine.  IMPRESSION: 1. Diffuse soft tissue inflammation around the anorectal canal, slightly more prominent on the right, with a likely associated ulceration along the overlying right-sided perineal soft tissues. Minimal air within the right-sided soft  tissues likely reflects an underlying sinus tract, without a defined abscess. No  evidence of involvement of osseous structures at this time. Mild wall thickening noted along the distal rectum. 2. Mild bilateral hydronephrosis is thought to reflect marked distention of the bladder. No evidence of distal obstructing stone. Multiple large right renal cysts seen. 3. Linear 3.5 cm hypodensity within the right hepatic lobe may reflect remote injury or a cyst. 4. Cholelithiasis; gallbladder otherwise unremarkable in appearance. 5. Scattered diverticulosis along the proximal sigmoid colon, without evidence of diverticulitis. 6. Scattered calcification along the abdominal aorta and its branches.   Electronically Signed   By: Garald Balding M.D.   On: 07/20/2014 21:06   Ct Head Wo Contrast  07/20/2014   CLINICAL DATA:  Altered mental status.  Dementia  EXAM: CT HEAD WITHOUT CONTRAST  TECHNIQUE: Contiguous axial images were obtained from the base of the skull through the vertex without intravenous contrast.  COMPARISON:  May 12, 2014  FINDINGS: Moderate diffuse atrophy is stable. There is no intracranial mass, hemorrhage, extra-axial fluid collection, or midline shift. 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 There is evidence of a prior infarct at the superior temporal -occipital junction on the right, stable. There is extensive small vessel disease throughout the centra semiovale bilaterally. There is small vessel disease throughout both external capsules. There is no new gray-white compartment lesion. No acute infarct apparent. Bony calvarium appears intact. The mastoid air cells are clear.  IMPRESSION: Atrophy with prior infarcts on the right as well as widespread small vessel disease. No intracranial mass, hemorrhage, or acute appearing infarct.   Electronically Signed   By: Lowella Grip M.D.    On: 07/20/2014 19:19   Dg Chest Port 1 View  07/20/2014   CLINICAL DATA:  Weakness and altered mental status  EXAM: PORTABLE CHEST - 1 VIEW  COMPARISON:  None.  FINDINGS: There is no edema or consolidation. There is fibrotic type change in the periphery of the lungs bilaterally. Heart is mildly enlarged with pulmonary vascularity within normal limits. There is atherosclerotic change in a. No adenopathy. No bone lesions.  IMPRESSION: Peripheral fibrosis in the lungs bilaterally. No frank edema or consolidation. Mild cardiomegaly.   Electronically Signed   By: Lowella Grip M.D.   On: 07/20/2014 17:45    Electrocardiogram: Atrial fibrillation at 104 bpm. Normal axis. Intervals appear to be normal. Nonspecific T wave changes. No concerning ST changes.  Problem List  Principal Problem:   Sepsis due to cellulitis Active Problems:   Sacral decubitus ulcer   Infected decubitus ulcer   Chronic a-fib   Dementia   Essential hypertension   Assessment: This is a 81 year old Caucasian male who presents from a skilled nursing facility with weakness. He was noted to be febrile in the emergency department with elevated lactic acid level. He has infected sacral decubitus ulcer. No abscesses noted on CT scan. He appears to be septic from this source.  Plan: #1 Sepsis due to infected sacral decubitus: Lactic acid level was elevated. With IV hydration, it has come down. He was febrile. Blood cultures have been obtained. Continued IV fluids. Monitor heart rate and blood pressure on telemetry. Patient currently is hemodynamically stable.  #2 Infected sacral decub with other abnormalities noted on CT abdomen: The findings of the CT abdomen were discussed with General surgery by ED physician. They will see the patient in the morning. There is no indication for urgent surgical intervention. Patient  will be placed on vancomycin and Zosyn for now.  #3 Chronic atrial fibrillation: Monitor on telemetry. He does not  appear to be on any rate limiting drugs. Metoprolol intravenously as needed. He is not on anticoagulation, presumably due to his dementia  #4 acute on chronic renal failure: Hydrate him gently. He is making urine. Hydronephrosis was noted on CT scan. Likely due to distended bladder. Continue to monitor urine output.  #5 Advanced Alzheimer's Dementia: Continue with Aricept and Namenda. He stays in a memory care unit.  #6 history of essential hypertension: Hold his antihypertensive agents. Monitor blood pressures closely.  #7 Incidental cholelithiasis noted on CT: Appears to be asymptomatic. AST is mildly elevated. He is not tender in the right upper quadrant. Continue to monitor for now.   DVT Prophylaxis: SCDs Code Status: Full code Family Communication: No family available  Disposition Plan: Admit to telemetry   Further management decisions will depend on results of further testing and patient's response to treatment.   [facility]  [facility] Pager [phone]  If 7PM-7AM, please contact night-coverage [email or web address] Password [email or web address]  07/20/2014, 10:59 PM

## 2014-07-20 NOTE — ED Provider Notes (Signed)
CSN: [id number]     Arrival date & time 07/20/14  1647 History   First MD Initiated Contact with Patient 07/20/14 1700     Chief Complaint  Patient presents with  . Altered Mental Status     (Consider location/radiation/quality/duration/timing/severity/associated sxs/prior Treatment) Patient is a 81 y.o. male presenting with general illness.  Illness Quality:  Generalized weakness Severity:  Moderate Onset quality:  Gradual Duration: several days. Timing:  Constant Progression:  Worsening Context:  Lives at nursing facility Relieved by:  Nothing Worsened by:  Trying to get up and ambulate Associated symptoms: no abdominal pain, no chest pain, no fever, no nausea, no shortness of breath and no vomiting     Past Medical History  Diagnosis Date  . Atrial fibrillation   . History of stroke   . Hypertension   . Aortic insufficiency   . Moderate mitral regurgitation   . Stroke   . Pernicious anemia   . Peripheral vascular disease   . Peripheral neuropathy    History reviewed. No pertinent past surgical history. History reviewed. No pertinent family history. History  Substance Use Topics  . Smoking status: Never Smoker   . Smokeless tobacco: Not on file  . Alcohol Use: No    Review of Systems  Constitutional: Negative for fever.  Respiratory: Negative for shortness of breath.   Cardiovascular: Negative for chest pain.  Gastrointestinal: Negative for nausea, vomiting and abdominal pain.  All other systems reviewed and are negative.     Allergies  Review of patient's allergies indicates no known allergies.  Home Medications   Prior to Admission medications   Medication Sig Start Date End Date Taking? Authorizing Provider  acetaminophen (TYLENOL) 325 MG tablet Take 650 mg by mouth every 6 (six) hours as needed for mild pain.   Yes Historical Provider, MD  aspirin 325 MG tablet Take 325 mg by mouth daily.   Yes Historical Provider, MD  divalproex (DEPAKOTE) 125 MG  DR tablet Take 125 mg by mouth 2 (two) times daily.   Yes Historical Provider, MD  donepezil (ARICEPT) 10 MG tablet Take 10 mg by mouth at bedtime.   Yes Historical Provider, MD  feeding supplement (ENSURE IMMUNE HEALTH) LIQD Take 237 mLs by mouth daily.   Yes Historical Provider, MD  loperamide (IMODIUM) 2 MG capsule Take 2-4 mg by mouth as needed for diarrhea or loose stools.   Yes Historical Provider, MD  LORazepam (ATIVAN) 0.5 MG tablet Take 0.5 mg by mouth every 8 (eight) hours as needed for anxiety or sleep.   Yes Historical Provider, MD  memantine (NAMENDA) 10 MG tablet Take 10 mg by mouth 2 (two) times daily.   Yes Historical Provider, MD  NYSTATIN EX Apply 1 application topically 3 (three) times daily. Apply to buttocks until healed   Yes Historical Provider, MD   BP 135/62 mmHg  Pulse 107  Temp(Src) 98.8 F (37.1 C) (Oral)  Resp 20  Ht  (1.778 m)  Wt 175 lb (79.379 kg)  BMI 25.11 kg/m2  SpO2 97% Physical Exam  Constitutional: He appears well-developed and well-nourished. No distress.  HENT:  Head: Normocephalic and atraumatic.  Mouth/Throat: Oropharynx is clear and moist.  Eyes: Conjunctivae are normal. Pupils are equal, round, and reactive to light. No scleral icterus.  Neck: Neck supple.  Cardiovascular: Normal rate, regular rhythm, normal heart sounds and intact distal pulses.   No murmur heard. Pulmonary/Chest: Effort normal and breath sounds normal. No stridor. No respiratory distress. He has  no wheezes. He has no rales.  Abdominal: Soft. He exhibits no distension. There is no tenderness.  Genitourinary: Penis normal. Right testis shows tenderness. Right testis shows no mass and no swelling. Left testis shows tenderness. Left testis shows no mass and no swelling.  Scrotum is mildly erythematous without swelling, crepitus, or tenderness of scrotum or perineum.  Testicles are mildly tender to palpation without bogginess or masses.  Sacral area has decubitus ulcer which  is draining small amount of serosanguinous fluid.  Surrounding this ulcer is erythematous and indurated skin without palpable masses.    Musculoskeletal: Normal range of motion. He exhibits no edema.  Neurological: He is alert. He is disoriented.  Skin: Skin is warm and dry. No rash noted.  Psychiatric: He has a normal mood and affect. His behavior is normal.  Nursing note and vitals reviewed.   ED Course  CRITICAL CARE Performed by: Orson GearWOFFORD III, Roylene Heaton DAVID Authorized by: Orson GearWOFFORD III, Yago Ludvigsen DAVID Total critical care time: 35 minutes Critical care time was exclusive of separately billable procedures and treating other patients. Critical care was necessary to treat or prevent imminent or life-threatening deterioration of the following conditions: sepsis. Critical care was time spent personally by me on the following activities: development of treatment plan with patient or surrogate, discussions with consultants, evaluation of patient's response to treatment, examination of patient, obtaining history from patient or surrogate, ordering and performing treatments and interventions, ordering and review of laboratory studies, ordering and review of radiographic studies, pulse oximetry, re-evaluation of patient's condition and review of old charts.   (including critical care time) Labs Review Labs Reviewed  CBC WITH DIFFERENTIAL/PLATELET - Abnormal; Notable for the following:    WBC 28.3 (*)    Neutrophils Relative % 90 (*)    Lymphocytes Relative 3 (*)    Neutro Abs 25.5 (*)    Monocytes Absolute 2.0 (*)    All other components within normal limits  COMPREHENSIVE METABOLIC PANEL - Abnormal; Notable for the following:    Glucose, Bld 135 (*)    BUN 67 (*)    Creatinine, Ser 2.63 (*)    Albumin 3.4 (*)    AST 73 (*)    GFR calc non Af Amer 20 (*)    GFR calc Af Amer 24 (*)    All other components within normal limits  URINALYSIS, ROUTINE W REFLEX MICROSCOPIC - Abnormal; Notable for the  following:    Hgb urine dipstick MODERATE (*)    Protein, ur 30 (*)    All other components within normal limits  I-STAT CG4 LACTIC ACID, ED - Abnormal; Notable for the following:    Lactic Acid, Venous 2.65 (*)    All other components within normal limits  CULTURE, BLOOD (ROUTINE X 2)  CULTURE, BLOOD (ROUTINE X 2)  URINE CULTURE  URINE MICROSCOPIC-ADD ON  I-STAT CG4 LACTIC ACID, ED    Imaging Review No results found. All radiology studies independently viewed by me.     EKG Interpretation   Date/Time:  Wednesday July 20 2014 16:55:13 EST Ventricular Rate:  104 PR Interval:    QRS Duration: 88 QT Interval:  312 QTC Calculation: 410 R Axis:   82 Text Interpretation:  Atrial fibrillation Borderline right axis deviation  nonspecific st/t changes, similar to prior Confirmed by [facility] ForsythWOFFORD  MD, TREY  ([id number]) on 07/20/2014 5:03:19 PM      MDM   Final diagnoses:  Weakness  Fever  Sacral wound  Severe sepsis with acute organ dysfunction  Cellulitis of sacral region    81 yo male with hx of dementia who presents from his nursing facility due to worsening generalized weakness and inability to ambulate.    Labwork showed leukocytosis and acute on chronic renal insufficiency.  Initially, pt's presentation did not appear to be consistent with sepsis (only vital sign abnormality was mild tachycardia associated with a fib).  However, subsequently pt developed fever and was found to have leukocytosis.  He was then treated for sepsis with antibiotics.  On exam, pt has cellulitis associated with a sacral decubitus ulcer.  His perineal exam does not appear consistent with fournier's gangrene.  Pt does not appear to have a need for an emergent surgical intervention, but I discussed the results of his CT scan with Dr. Dwain Sarna.  He will follow pt while in hospital.  Discussed case with Dr. Rito Ehrlich who will admit for further management.   Candyce Churn III, MD 07/21/14 [phone]

## 2014-07-20 NOTE — ED Notes (Signed)
MD at bedside. Skin evaluation completed with MD. Pt has a sacral stage 2 pressure ulcer that is oozing serosanguinous fluid. Scrotum are extremely red and excoriated and painful to palpation.

## 2014-07-21 ENCOUNTER — Encounter (HOSPITAL_COMMUNITY): Payer: Self-pay | Admitting: General Surgery

## 2014-07-21 LAB — PROCALCITONIN: Procalcitonin: 2.87 ng/mL

## 2014-07-21 LAB — COMPREHENSIVE METABOLIC PANEL
ALT: 30 U/L (ref 0–53)
AST: 54 U/L — AB (ref 0–37)
Albumin: 2.7 g/dL — ABNORMAL LOW (ref 3.5–5.2)
Alkaline Phosphatase: 84 U/L (ref 39–117)
Anion gap: 9 (ref 5–15)
BILIRUBIN TOTAL: 0.9 mg/dL (ref 0.3–1.2)
BUN: 59 mg/dL — ABNORMAL HIGH (ref 6–23)
CALCIUM: 8.7 mg/dL (ref 8.4–10.5)
CO2: 24 mmol/L (ref 19–32)
Chloride: 103 mmol/L (ref 96–112)
Creatinine, Ser: 2.77 mg/dL — ABNORMAL HIGH (ref 0.50–1.35)
GFR calc Af Amer: 22 mL/min — ABNORMAL LOW (ref >90)
GFR calc non Af Amer: 19 mL/min — ABNORMAL LOW (ref >90)
Glucose, Bld: 113 mg/dL — ABNORMAL HIGH (ref 70–99)
Potassium: 4 mmol/L (ref 3.5–5.1)
SODIUM: 136 mmol/L (ref 135–145)
TOTAL PROTEIN: 6.6 g/dL (ref 6.0–8.3)

## 2014-07-21 LAB — CBC
HEMATOCRIT: 39 % (ref 39.0–52.0)
Hemoglobin: 12.6 g/dL — ABNORMAL LOW (ref 13.0–17.0)
MCH: 31 pg (ref 26.0–34.0)
MCHC: 32.3 g/dL (ref 30.0–36.0)
MCV: 95.8 fL (ref 78.0–100.0)
PLATELETS: 181 10*3/uL (ref 150–400)
RBC: 4.07 MIL/uL — AB (ref 4.22–5.81)
RDW: 14.8 % (ref 11.5–15.5)
WBC: 28.7 10*3/uL — AB (ref 4.0–10.5)

## 2014-07-21 LAB — URINE CULTURE
CULTURE: NO GROWTH
Colony Count: NO GROWTH

## 2014-07-21 LAB — MRSA PCR SCREENING: MRSA by PCR: NEGATIVE

## 2014-07-21 MED ORDER — ENSURE COMPLETE PO LIQD
237.0000 mL | Freq: Two times a day (BID) | ORAL | Status: DC
  Administered 2014-07-22 – 2014-07-28 (×8): 237 mL via ORAL

## 2014-07-21 MED ORDER — DIVALPROEX SODIUM 125 MG PO DR TAB
125.0000 mg | DELAYED_RELEASE_TABLET | Freq: Two times a day (BID) | ORAL | Status: DC
  Administered 2014-07-21 – 2014-07-28 (×14): 125 mg via ORAL
  Filled 2014-07-21 (×16): qty 1

## 2014-07-21 MED ORDER — LIDOCAINE-EPINEPHRINE 2 %-1:100000 IJ SOLN
20.0000 mL | Freq: Once | INTRAMUSCULAR | Status: AC
  Administered 2014-07-21: 20 mL via INTRADERMAL
  Filled 2014-07-21: qty 20

## 2014-07-21 MED ORDER — MEMANTINE HCL 10 MG PO TABS
10.0000 mg | ORAL_TABLET | Freq: Two times a day (BID) | ORAL | Status: DC
  Administered 2014-07-21 – 2014-07-28 (×15): 10 mg via ORAL
  Filled 2014-07-21 (×18): qty 1

## 2014-07-21 MED ORDER — PIPERACILLIN-TAZOBACTAM 3.375 G IVPB
3.3750 g | Freq: Three times a day (TID) | INTRAVENOUS | Status: DC
  Administered 2014-07-21 (×2): 3.375 g via INTRAVENOUS
  Filled 2014-07-21 (×3): qty 50

## 2014-07-21 MED ORDER — DONEPEZIL HCL 10 MG PO TABS
10.0000 mg | ORAL_TABLET | Freq: Every day | ORAL | Status: DC
  Administered 2014-07-21 – 2014-07-27 (×8): 10 mg via ORAL
  Filled 2014-07-21 (×9): qty 1

## 2014-07-21 MED ORDER — ONDANSETRON HCL 4 MG/2ML IJ SOLN: 4.0000 mg | Freq: Four times a day (QID) | INTRAMUSCULAR | Status: DC | PRN

## 2014-07-21 MED ORDER — ONDANSETRON HCL 4 MG PO TABS: 4.0000 mg | ORAL_TABLET | Freq: Four times a day (QID) | ORAL | Status: DC | PRN

## 2014-07-21 MED ORDER — ADULT MULTIVITAMIN W/MINERALS CH
1.0000 | ORAL_TABLET | Freq: Every day | ORAL | Status: DC
  Administered 2014-07-21 – 2014-07-28 (×7): 1 via ORAL
  Filled 2014-07-21 (×8): qty 1

## 2014-07-21 MED ORDER — VANCOMYCIN HCL IN DEXTROSE 750-5 MG/150ML-% IV SOLN
750.0000 mg | Freq: Once | INTRAVENOUS | Status: AC
  Administered 2014-07-21: 750 mg via INTRAVENOUS
  Filled 2014-07-21: qty 150

## 2014-07-21 MED ORDER — RESOURCE THICKENUP CLEAR PO POWD
ORAL | Status: DC | PRN
  Filled 2014-07-21: qty 125

## 2014-07-21 MED ORDER — ACETAMINOPHEN 325 MG PO TABS
650.0000 mg | ORAL_TABLET | Freq: Four times a day (QID) | ORAL | Status: DC | PRN
  Administered 2014-07-25 – 2014-07-28 (×2): 650 mg via ORAL
  Filled 2014-07-21 (×2): qty 2

## 2014-07-21 MED ORDER — ACETAMINOPHEN 650 MG RE SUPP: 650.0000 mg | Freq: Four times a day (QID) | RECTAL | Status: DC | PRN

## 2014-07-21 MED ORDER — VANCOMYCIN HCL IN DEXTROSE 750-5 MG/150ML-% IV SOLN: 750.0000 mg | INTRAVENOUS | Status: DC

## 2014-07-21 MED ORDER — VANCOMYCIN HCL IN DEXTROSE 1-5 GM/200ML-% IV SOLN: 1000.0000 mg | INTRAVENOUS | Status: DC

## 2014-07-21 MED ORDER — METOPROLOL TARTRATE 1 MG/ML IV SOLN
2.5000 mg | Freq: Four times a day (QID) | INTRAVENOUS | Status: DC | PRN
  Filled 2014-07-21: qty 5

## 2014-07-21 MED ORDER — SODIUM CHLORIDE 0.9 % IV SOLN
INTRAVENOUS | Status: AC
  Administered 2014-07-21: 01:00:00 via INTRAVENOUS

## 2014-07-21 MED ORDER — SODIUM CHLORIDE 0.9 % IJ SOLN
3.0000 mL | Freq: Two times a day (BID) | INTRAMUSCULAR | Status: DC
  Administered 2014-07-21 – 2014-07-27 (×7): 3 mL via INTRAVENOUS

## 2014-07-21 MED ORDER — ASPIRIN 325 MG PO TABS
325.0000 mg | ORAL_TABLET | Freq: Every day | ORAL | Status: DC
  Administered 2014-07-21 – 2014-07-23 (×3): 325 mg via ORAL
  Filled 2014-07-21 (×4): qty 1

## 2014-07-21 MED ORDER — PIPERACILLIN-TAZOBACTAM IN DEX 2-0.25 GM/50ML IV SOLN
2.2500 g | Freq: Four times a day (QID) | INTRAVENOUS | Status: DC
  Administered 2014-07-21 – 2014-07-23 (×8): 2.25 g via INTRAVENOUS
  Filled 2014-07-21 (×9): qty 50

## 2014-07-21 NOTE — Progress Notes (Signed)
ANTIBIOTIC CONSULT NOTE - INITIAL  Pharmacy Consult for Vancomycin and Zosyn  Indication: sepsis due to cellulitis  No Known Allergies  Patient Measurements: Height: 5\' 10"  (177.8 cm) Weight: 175 lb (79.379 kg) IBW/kg (Calculated) : 73 Adjusted Body Weight:   Vital Signs: Temp: 98.8 F (37.1 C) (02/04 0032) Temp Source: Oral (02/04 0032) BP: 152/71 mmHg (02/04 0032) Pulse Rate: 100 (02/04 0032) Intake/Output from previous day:   Intake/Output from this shift:    Labs:  Recent Labs  07/20/14 1713  WBC 28.3*  HGB 13.7  PLT 203  CREATININE 2.63*   Estimated Creatinine Clearance: 20.4 mL/min (by C-G formula based on Cr of 2.63). No results for input(s): VANCOTROUGH, VANCOPEAK, VANCORANDOM, GENTTROUGH, GENTPEAK, GENTRANDOM, TOBRATROUGH, TOBRAPEAK, TOBRARND, AMIKACINPEAK, AMIKACINTROU, AMIKACIN in the last 72 hours.   Microbiology: Recent Results (from the past 720 hour(s))  MRSA PCR Screening     Status: None   Collection Time: 07/21/14 12:59 AM  Result Value Ref Range Status   MRSA by PCR NEGATIVE NEGATIVE Final    Comment:        The GeneXpert MRSA Assay (FDA approved for NASAL specimens only), is one component of a comprehensive MRSA colonization surveillance program. It is not intended to diagnose MRSA infection nor to guide or monitor treatment for MRSA infections.     Medical History: Past Medical History  Diagnosis Date  . Atrial fibrillation   . History of stroke   . Hypertension   . Aortic insufficiency   . Moderate mitral regurgitation   . Stroke   . Pernicious anemia   . Peripheral vascular disease   . Peripheral neuropathy     Medications:  Anti-infectives    Start     Dose/Rate Route Frequency Ordered Stop   07/21/14 2200  vancomycin (VANCOCIN) IVPB 750 mg/150 ml premix     750 mg150 mL/hr over 60 Minutes Intravenous Every 24 hours 07/21/14 0515     07/21/14 0100  piperacillin-tazobactam (ZOSYN) IVPB 3.375 g     3.375 g12.5 mL/hr  over 240 Minutes Intravenous 3 times per day 07/21/14 0056     07/20/14 2030  vancomycin (VANCOCIN) IVPB 1000 mg/200 mL premix     1,000 mg200 mL/hr over 60 Minutes Intravenous  Once 07/20/14 2026 07/20/14 2203   07/20/14 1830  cefTRIAXone (ROCEPHIN) 1 g in dextrose 5 % 50 mL IVPB     1 g100 mL/hr over 30 Minutes Intravenous  Once 07/20/14 1823 07/20/14 1931     Assessment: Patient with sepsis due to cellulitis.  First dose of antibiotics already given.  Patient with very poor renal function.  Goal of Therapy:  Vancomycin trough level 15-20 mcg/ml  Zosyn based on renal function   Plan:  Measure antibiotic drug levels at steady state Follow up culture results Vancomycin 750mg  iv q24hr  Zosyn 3.375g IV Q8H infused over 4hrs.   Darlina GuysGrimsley Jr, Jacquenette ShoneJulian Crowford 07/21/2014,5:16 AM

## 2014-07-21 NOTE — Procedures (Signed)
Incision and Debridement Procedure Note  Pre-operative Diagnosis: infected sacral decubitus ulcer  Post-operative Diagnosis: same  Indications: Darryl Summers is an 81 year old nursing home patient with a history of advanced dementia, atrial fibrillation and hypertension who presented with weakness.He was found to have a sacral decubitus ulcer which was felt to be the source of infection. His work up shows leukocytosis with WBC of 28.3K, CT of abdomen and pelvis revealed soft tissue inflammation right perianal region.  He has remained afebrile and hemodynamically stable. Currently on Vanc and zosyn D#2.  After examining the wound, we recommended bedside debridement of the wound.   Anesthesia: lidocaine 2% with epinephrine   Procedure Details  The procedure, risks and complications have been discussed in detail (including, but not limited to airway compromise, infection, bleeding) with the patient, and the patient has signed consent to the procedure. The patient was placed in right lateral position. The skin was sterilely prepped and draped over the affected area in the usual fashion.  After adequate local anesthesia, I used sharp dissection with #11 scalpel and scissors to remove necrotic tissue to a area 3x3x2cm in including skin and subcutaneous tissue. The depth of devitalized tissue was at least 2cm.  There was purulent drainage and undermining inferior of the wound, this measured 3x3x7cm.  The patient could not tolerate further debridement. A significant amount of slough remained. I aborted further debridement. I packed the wound with a 4x4. Hemostasis was achieved with manual pressure and a dry dressing was applied.  The patient was observed until stable.  Measurement of devitalized tissue before debridement 3x3cm After debridement 3x3x7cm Infection-present Presence of non viable tissue-3x3x7cm and likely more, will need further debridement  No other material in the wound that  would inhibit wound healing No viable tissue was removed   Findings: Infected decubitus ulcer  EBL: <5 cc's  Drains: none  Condition: Tolerated procedure well   Complications: none.   We will re-evaluate his wound tomorrow. He may need further debridement in the operating room.   Quierra Silverio, ANP-BC

## 2014-07-21 NOTE — Consult Note (Signed)
Reason for Consult: sacral decubitus ulcer Referring Physician: Dr. Bonnielee Haff   HPI: Darryl Summers is an 81 year old nursing home patient with a history of advanced dementia, atrial fibrillation and hypertension who presented with weakness.  I was unable to obtain anything from the patient due to his dementia.  He knows he is in the hospital, but unable to provide additional information.   His work up shows leukocytosis with WBC of 28.3K, CT of abdomen and pelvis revealed soft tissue inflammation right perianal region. We have therefore been asked to evaluate the patient.  He has remained afebrile and hemodynamically stable.  Currently on Vanc and zosyn D#2.    Past Medical History  Diagnosis Date  . Atrial fibrillation   . History of stroke   . Hypertension   . Aortic insufficiency   . Moderate mitral regurgitation   . Stroke   . Pernicious anemia   . Peripheral vascular disease   . Peripheral neuropathy     History reviewed. No pertinent past surgical history.  History reviewed. No pertinent family history.  Social History:  reports that he has never smoked. He does not have any smokeless tobacco history on file. He reports that he does not drink alcohol. His drug history is not on file.  Allergies: No Known Allergies  Medications:  Prior to Admission medications   Medication Sig Start Date End Date Taking? Authorizing Provider  acetaminophen (TYLENOL) 325 MG tablet Take 650 mg by mouth every 6 (six) hours as needed for mild pain.   Yes Historical Provider, MD  aspirin 325 MG tablet Take 325 mg by mouth daily.   Yes Historical Provider, MD  divalproex (DEPAKOTE) 125 MG DR tablet Take 125 mg by mouth 2 (two) times daily.   Yes Historical Provider, MD  donepezil (ARICEPT) 10 MG tablet Take 10 mg by mouth at bedtime.   Yes Historical Provider, MD  feeding supplement (ENSURE IMMUNE HEALTH) LIQD Take 237 mLs by mouth daily.   Yes Historical Provider, MD  loperamide  (IMODIUM) 2 MG capsule Take 2-4 mg by mouth as needed for diarrhea or loose stools.   Yes Historical Provider, MD  LORazepam (ATIVAN) 0.5 MG tablet Take 0.5 mg by mouth every 8 (eight) hours as needed for anxiety or sleep.   Yes Historical Provider, MD  memantine (NAMENDA) 10 MG tablet Take 10 mg by mouth 2 (two) times daily.   Yes Historical Provider, MD  NYSTATIN EX Apply 1 application topically 3 (three) times daily. Apply to buttocks until healed   Yes Historical Provider, MD     Results for orders placed or performed during the hospital encounter of 07/20/14 (from the past 48 hour(s))  Blood Culture (routine x 2)     Status: None (Preliminary result)   Collection Time: 07/20/14  5:12 PM  Result Value Ref Range   Specimen Description BLOOD BLOOD LEFT FOREARM    Special Requests BOTTLES DRAWN AEROBIC AND ANAEROBIC 5 CC EA    Culture             BLOOD CULTURE RECEIVED NO GROWTH TO DATE CULTURE WILL BE HELD FOR 5 DAYS BEFORE ISSUING A FINAL NEGATIVE REPORT Performed at [facility]    Report Status PENDING   CBC WITH DIFFERENTIAL     Status: Abnormal   Collection Time: 07/20/14  5:13 PM  Result Value Ref Range   WBC 28.3 (H) 4.0 - 10.5 K/uL   RBC 4.39 4.22 - 5.81 MIL/uL  Hemoglobin 13.7 13.0 - 17.0 g/dL   HCT 42.2 39.0 - 52.0 %   MCV 96.1 78.0 - 100.0 fL   MCH 31.2 26.0 - 34.0 pg   MCHC 32.5 30.0 - 36.0 g/dL   RDW 14.7 11.5 - 15.5 %   Platelets 203 150 - 400 K/uL   Neutrophils Relative % 90 (H) 43 - 77 %   Lymphocytes Relative 3 (L) 12 - 46 %   Monocytes Relative 7 3 - 12 %   Eosinophils Relative 0 0 - 5 %   Basophils Relative 0 0 - 1 %   Neutro Abs 25.5 (H) 1.7 - 7.7 K/uL   Lymphs Abs 0.8 0.7 - 4.0 K/uL   Monocytes Absolute 2.0 (H) 0.1 - 1.0 K/uL   Eosinophils Absolute 0.0 0.0 - 0.7 K/uL   Basophils Absolute 0.0 0.0 - 0.1 K/uL   WBC Morphology MILD LEFT SHIFT (1-5% METAS, OCC MYELO, OCC BANDS)     Comment: VACUOLATED NEUTROPHILS  Comprehensive metabolic panel      Status: Abnormal   Collection Time: 07/20/14  5:13 PM  Result Value Ref Range   Sodium 135 135 - 145 mmol/L   Potassium 3.7 3.5 - 5.1 mmol/L   Chloride 98 96 - 112 mmol/L   CO2 24 19 - 32 mmol/L   Glucose, Bld 135 (H) 70 - 99 mg/dL   BUN 67 (H) 6 - 23 mg/dL   Creatinine, Ser 2.63 (H) 0.50 - 1.35 mg/dL   Calcium 9.4 8.4 - 10.5 mg/dL   Total Protein 8.0 6.0 - 8.3 g/dL   Albumin 3.4 (L) 3.5 - 5.2 g/dL   AST 73 (H) 0 - 37 U/L   ALT 32 0 - 53 U/L   Alkaline Phosphatase 101 39 - 117 U/L   Total Bilirubin 1.1 0.3 - 1.2 mg/dL   GFR calc non Af Amer 20 (L) >90 mL/min   GFR calc Af Amer 24 (L) >90 mL/min    Comment: (NOTE) The eGFR has been calculated using the CKD EPI equation. This calculation has not been validated in all clinical situations. eGFR's persistently <90 mL/min signify possible Chronic Kidney Disease.    Anion gap 13 5 - 15  I-Stat CG4 Lactic Acid, ED (not at [facility])     Status: Abnormal   Collection Time: 07/20/14  5:19 PM  Result Value Ref Range   Lactic Acid, Venous 2.65 (HH) 0.5 - 2.0 mmol/L   Comment NOTIFIED PHYSICIAN   Blood Culture (routine x 2)     Status: None (Preliminary result)   Collection Time: 07/20/14  5:20 PM  Result Value Ref Range   Specimen Description BLOOD LEFT ANTECUBITAL    Special Requests BOTTLES DRAWN AEROBIC AND ANAEROBIC 5CC EACH    Culture             BLOOD CULTURE RECEIVED NO GROWTH TO DATE CULTURE WILL BE HELD FOR 5 DAYS BEFORE ISSUING A FINAL NEGATIVE REPORT Performed at [facility]    Report Status PENDING   Urinalysis, Routine w reflex microscopic     Status: Abnormal   Collection Time: 07/20/14  6:16 PM  Result Value Ref Range   Color, Urine YELLOW YELLOW   APPearance CLEAR CLEAR   Specific Gravity, Urine 1.019 1.005 - 1.030   pH 5.5 5.0 - 8.0   Glucose, UA NEGATIVE NEGATIVE mg/dL   Hgb urine dipstick MODERATE (A) NEGATIVE   Bilirubin Urine NEGATIVE NEGATIVE   Ketones, ur NEGATIVE NEGATIVE mg/dL  Protein, ur 30 (A)  NEGATIVE mg/dL   Urobilinogen, UA 0.2 0.0 - 1.0 mg/dL   Nitrite NEGATIVE NEGATIVE   Leukocytes, UA NEGATIVE NEGATIVE  Urine microscopic-add on     Status: None   Collection Time: 07/20/14  6:16 PM  Result Value Ref Range   Squamous Epithelial / LPF RARE RARE   WBC, UA 0-2 <3 WBC/hpf   RBC / HPF 0-2 <3 RBC/hpf   Bacteria, UA RARE RARE  I-Stat CG4 Lactic Acid, ED (not at [facility])     Status: None   Collection Time: 07/20/14  7:56 PM  Result Value Ref Range   Lactic Acid, Venous 1.93 0.5 - 2.0 mmol/L  MRSA PCR Screening     Status: None   Collection Time: 07/21/14 12:59 AM  Result Value Ref Range   MRSA by PCR NEGATIVE NEGATIVE    Comment:        The GeneXpert MRSA Assay (FDA approved for NASAL specimens only), is one component of a comprehensive MRSA colonization surveillance program. It is not intended to diagnose MRSA infection nor to guide or monitor treatment for MRSA infections.   Comprehensive metabolic panel     Status: Abnormal   Collection Time: 07/21/14  5:30 AM  Result Value Ref Range   Sodium 136 135 - 145 mmol/L   Potassium 4.0 3.5 - 5.1 mmol/L   Chloride 103 96 - 112 mmol/L   CO2 24 19 - 32 mmol/L   Glucose, Bld 113 (H) 70 - 99 mg/dL   BUN 59 (H) 6 - 23 mg/dL   Creatinine, Ser 2.77 (H) 0.50 - 1.35 mg/dL   Calcium 8.7 8.4 - 10.5 mg/dL   Total Protein 6.6 6.0 - 8.3 g/dL   Albumin 2.7 (L) 3.5 - 5.2 g/dL   AST 54 (H) 0 - 37 U/L   ALT 30 0 - 53 U/L   Alkaline Phosphatase 84 39 - 117 U/L   Total Bilirubin 0.9 0.3 - 1.2 mg/dL   GFR calc non Af Amer 19 (L) >90 mL/min   GFR calc Af Amer 22 (L) >90 mL/min    Comment: (NOTE) The eGFR has been calculated using the CKD EPI equation. This calculation has not been validated in all clinical situations. eGFR's persistently <90 mL/min signify possible Chronic Kidney Disease.    Anion gap 9 5 - 15  CBC     Status: Abnormal   Collection Time: 07/21/14  5:30 AM  Result Value Ref Range   WBC 28.7 (H) 4.0 - 10.5 K/uL    RBC 4.07 (L) 4.22 - 5.81 MIL/uL   Hemoglobin 12.6 (L) 13.0 - 17.0 g/dL   HCT 39.0 39.0 - 52.0 %   MCV 95.8 78.0 - 100.0 fL   MCH 31.0 26.0 - 34.0 pg   MCHC 32.3 30.0 - 36.0 g/dL   RDW 14.8 11.5 - 15.5 %   Platelets 181 150 - 400 K/uL  Procalcitonin - Baseline     Status: None   Collection Time: 07/21/14  5:30 AM  Result Value Ref Range   Procalcitonin 2.87 ng/mL    Comment:        Interpretation: PCT > 2 ng/mL: Systemic infection (sepsis) is likely, unless other causes are known. (NOTE)         ICU PCT Algorithm               Non ICU PCT Algorithm    ----------------------------     ------------------------------  PCT < 0.25 ng/mL                 PCT < 0.1 ng/mL     Stopping of antibiotics            Stopping of antibiotics       strongly encouraged.               strongly encouraged.    ----------------------------     ------------------------------       PCT level decrease by               PCT < 0.25 ng/mL       >= 80% from peak PCT       OR PCT 0.25 - 0.5 ng/mL          Stopping of antibiotics                                             encouraged.     Stopping of antibiotics           encouraged.    ----------------------------     ------------------------------       PCT level decrease by              PCT >= 0.25 ng/mL       < 80% from peak PCT        AND PCT >= 0.5 ng/mL            Continuing antibiotics                                               encouraged.       Continuing antibiotics            encouraged.    ----------------------------     ------------------------------     PCT level increase compared          PCT > 0.5 ng/mL         with peak PCT AND          PCT >= 0.5 ng/mL             Escalation of antibiotics                                          strongly encouraged.      Escalation of antibiotics        strongly encouraged.     Ct Abdomen Pelvis Wo Contrast  07/20/2014   CLINICAL DATA:  Sacral decubitus ulceration. Worsening generalized  weakness and incontinence. Initial encounter.  EXAM: CT ABDOMEN AND PELVIS WITHOUT CONTRAST  TECHNIQUE: Multidetector CT imaging of the abdomen and pelvis was performed following the standard protocol without IV contrast.  COMPARISON:  CT of the abdomen and pelvis performed 05/24/2002, and renal ultrasound performed 07/06/2007  FINDINGS: Honeycombing and scarring are noted at the visualized lung bases.  A linear 3.5 cm hypodensity within the inferior right hepatic lobe may reflect remote injury or a cyst, increased in size from 2003. Previously noted hepatic cysts appear to have resolved. The spleen is unremarkable. Stones are noted dependently within the gallbladder. The gallbladder  is otherwise unremarkable. The pancreas and adrenal glands are unremarkable.  Mild bilateral hydronephrosis is noted. This is thought to reflect marked distention of the bladder, without evidence of a distal obstructing stone. Multiple large right renal cysts are seen, measuring up to 9.1 cm in size. Nonspecific perinephric stranding is noted bilaterally. No nonobstructing renal stones are identified.  No free fluid is identified. The small bowel is unremarkable in appearance. The stomach is within normal limits. No acute vascular abnormalities are seen. Scattered calcification is noted along the abdominal aorta and its branches.  The appendix is normal in caliber, without evidence for appendicitis. Scattered diverticulosis is noted along the proximal sigmoid colon, and a single diverticulum is noted at the proximal descending colon, without evidence of diverticulitis.  The bladder is significantly distended. The prostate is mildly enlarged, measuring 5.1 cm in transverse dimension, with scattered calcification. No inguinal lymphadenopathy is seen.  There is diffuse soft tissue inflammation about the anorectal canal, slightly more prominent on the right, with a likely associated ulceration along the overlying right-sided perineal soft  tissues. Minimal air is noted within the right-sided soft tissues, likely reflect an underlying sinus tract, without a defined abscess. There is mild wall thickening along the distal rectum.  No acute osseous abnormalities are identified. Multilevel vacuum phenomenon is noted at the lower lumbar spine.  IMPRESSION: 1. Diffuse soft tissue inflammation around the anorectal canal, slightly more prominent on the right, with a likely associated ulceration along the overlying right-sided perineal soft tissues. Minimal air within the right-sided soft tissues likely reflects an underlying sinus tract, without a defined abscess. No evidence of involvement of osseous structures at this time. Mild wall thickening noted along the distal rectum. 2. Mild bilateral hydronephrosis is thought to reflect marked distention of the bladder. No evidence of distal obstructing stone. Multiple large right renal cysts seen. 3. Linear 3.5 cm hypodensity within the right hepatic lobe may reflect remote injury or a cyst. 4. Cholelithiasis; gallbladder otherwise unremarkable in appearance. 5. Scattered diverticulosis along the proximal sigmoid colon, without evidence of diverticulitis. 6. Scattered calcification along the abdominal aorta and its branches.   Electronically Signed   By: Garald Balding M.D.   On: 07/20/2014 21:06   Ct Head Wo Contrast  07/20/2014   CLINICAL DATA:  Altered mental status.  Dementia  EXAM: CT HEAD WITHOUT CONTRAST  TECHNIQUE: Contiguous axial images were obtained from the base of the skull through the vertex without intravenous contrast.  COMPARISON:  May 12, 2014  FINDINGS: Moderate diffuse atrophy is stable. There is no intracranial mass, hemorrhage, extra-axial fluid collection, or midline shift. 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 There is evidence of a prior infarct at the superior  temporal -occipital junction on the right, stable. There is extensive small vessel disease throughout the centra semiovale bilaterally. There is small vessel disease throughout both external capsules. There is no new gray-white compartment lesion. No acute infarct apparent. Bony calvarium appears intact. The mastoid air cells are clear.  IMPRESSION: Atrophy with prior infarcts on the right as well as widespread small vessel disease. No intracranial mass, hemorrhage, or acute appearing infarct.   Electronically Signed   By: Lowella Grip M.D.   On: 07/20/2014 19:19   Dg Chest Port 1 View  07/20/2014   CLINICAL DATA:  Weakness and altered mental status  EXAM: PORTABLE CHEST - 1  VIEW  COMPARISON:  None.  FINDINGS: There is no edema or consolidation. There is fibrotic type change in the periphery of the lungs bilaterally. Heart is mildly enlarged with pulmonary vascularity within normal limits. There is atherosclerotic change in a. No adenopathy. No bone lesions.  IMPRESSION: Peripheral fibrosis in the lungs bilaterally. No frank edema or consolidation. Mild cardiomegaly.   Electronically Signed   By: Lowella Grip M.D.   On: 07/20/2014 17:45    Review of Systems  Unable to perform ROS  Blood pressure 122/68, pulse 91, temperature 98.1 F (36.7 C), temperature source Oral, resp. rate 18, height 5' 10"  (1.778 m), weight 175 lb (79.379 kg), SpO2 97 %. Physical Exam  Constitutional: He appears well-developed and well-nourished. He appears distressed.  Cardiovascular: Normal rate, regular rhythm, normal heart sounds and intact distal pulses.  Exam reveals no gallop and no friction rub.   No murmur heard. Respiratory: Effort normal and breath sounds normal. No respiratory distress. He has no wheezes. He has no rales. He exhibits no tenderness.  GI: Soft. Bowel sounds are normal. He exhibits no distension and no mass. There is no tenderness. There is no rebound and no guarding.  Musculoskeletal: Normal  range of motion. He exhibits no edema.  Neurological: He is alert.  A&O x2  Skin: Skin is warm. No rash noted. He is not diaphoretic. No pallor.  Psychiatric: He has a normal mood and affect.      Assessment/Plan: Necrotic/infected sacral decubitus ulcer -we can proceed with a bedside debridement.  Will have to speak with his family since he is unable to provide consent.  Agree with antibiotics. -mobilize/turn the patient to help prevent further skin breakdown -maximize nutrition  -will follow along -thank you for the consult   Algis Lehenbauer ANP-BC 07/21/2014, 9:22 AM

## 2014-07-21 NOTE — Evaluation (Signed)
Physical Therapy Evaluation Patient Details Name: Darryl Summers MRN: [id number] DOB: January 25, 1925 Today's Date: 07/21/2014   History of Present Illness  Darryl Summers is a 81 y.o. male adm with sacral decubitus, sepsis;  past medical history:Alzheimer's dementia, which appears to be moderate to advanced, atrial fibrillation not on anticoagulation, hypertension; pt resides in memory unit., was ambulatory at baseline  Clinical Impression  Pt admitted with above diagnosis. Pt currently with functional limitations due to the deficits listed below (see PT Problem List).  Pt will benefit from skilled PT to increase their independence and safety with mobility to allow discharge to the venue listed below.       Follow Up Recommendations SNF    Equipment Recommendations  None recommended by PT    Recommendations for Other Services       Precautions / Restrictions Precautions Precautions: Fall      Mobility  Bed Mobility Overal bed mobility: Needs Assistance Bed Mobility: Supine to Sit;Sit to Supine     Supine to sit: +2 for safety/equipment;Mod assist;HOB elevated Sit to supine: Mod assist;+2 for safety/equipment   General bed mobility comments: +2 for safety, incr time and cues for sequence, participation, attention to task; pt requires +2 total assist to scoot up in bed in supine  Transfers                 General transfer comment: attempted to stand, pt unable to wt shift -- WB on LEs with +2 assist;   Ambulation/Gait                Stairs            Wheelchair Mobility    Modified Rankin (Stroke Patients Only)       Balance Overall balance assessment: Needs assistance   Sitting balance-Leahy Scale: Poor Sitting balance - Comments: pt requiring variable levels of assist to sit EOB, multimodal cues to maintain upright and min to mod assist Postural control: Posterior lean   Standing balance-Leahy Scale: Zero Standing balance comment:  unable to come to full stand with +2 assist                             Pertinent Vitals/Pain Pain Assessment: Faces Faces Pain Scale: No hurt    Home Living Family/patient expects to be discharged to:: Skilled nursing facility                      Prior Function           Comments: unsure of baseline, chart states pt was ambulatory but weaker recently     Hand Dominance        Extremity/Trunk Assessment   Upper Extremity Assessment: Generalized weakness           Lower Extremity Assessment: Generalized weakness         Communication      Cognition Arousal/Alertness: Awake/alert Behavior During Therapy: Flat affect Overall Cognitive Status: History of cognitive impairments - at baseline Area of Impairment: Problem solving     Memory: Decreased short-term memory Following Commands: Follows one step commands inconsistently     Problem Solving: Decreased initiation;Difficulty sequencing;Requires verbal cues;Requires tactile cues      General Comments      Exercises        Assessment/Plan    PT Assessment Patient needs continued PT services  PT Diagnosis Difficulty walking;Generalized weakness   PT Problem  List Decreased strength;Decreased range of motion;Decreased balance;Decreased mobility;Decreased activity tolerance;Decreased safety awareness;Decreased cognition  PT Treatment Interventions DME instruction;Gait training;Functional mobility training;Therapeutic activities;Therapeutic exercise   PT Goals (Current goals can be found in the Care Plan section) Acute Rehab PT Goals Patient Stated Goal: pt unable to state PT Goal Formulation: Patient unable to participate in goal setting Time For Goal Achievement: 08/04/14 Potential to Achieve Goals: Good    Frequency Min 3X/week   Barriers to discharge        Co-evaluation               End of Session Equipment Utilized During Treatment: Gait belt Activity  Tolerance: Other (comment) (limited by cognition) Patient left: with call bell/phone within reach;in bed;with bed alarm set Nurse Communication: Mobility status         Time: [id number] PT Time Calculation (min) (ACUTE ONLY): 19 min   Charges:   PT Evaluation $Initial PT Evaluation Tier I: 1 Procedure     PT G CodesDrucilla Chalet:        Darryl Summers 07/21/2014, 12:18 PM

## 2014-07-21 NOTE — Progress Notes (Signed)
Patient had been retaining urine, so Dr. Blake DivineAkula notified, with orders received for in and out catheterization.  Patient voided prior to in and out catheterization.  Philomena Dohenyavid Bridgid Printz RN

## 2014-07-21 NOTE — Progress Notes (Signed)
[facility] PROGRESS NOTE  Darryl Summers MVH:[id number] DOB: [birth year] DOA: 07/20/2014 PCP: No primary care provider on file. Interim summary: Darryl Summers is a 81 y.o. male with a past medical history of Alzheimer's dementia, which appears to be moderate to advanced, atrial fibrillation not on anticoagulation, hypertension, who lives in a skilled nursing facility in their memory unit, was brought in for generalized weakness and unable to ambulate. He was on exam found to have infected sacral decubitus ulcer. Surgery was consulted and he underwent I&D. He was also started on IV vancomycin.  Assessment/Plan: 1. Sepsis:  Probably from infected sacral decubitus. He underwent I&D today by surgery. Plan to re assess in am for further debridement. rsume IV antibiotics and IV hydration.   Chronic atrial fib: Rate controlled.   Acute on chronic renal failure: An episode of urinary retention this am. Resolved . Continue to monitor. Slight worsening of renal function today, . Foley catheter to be placed.    Leukocytosis secondary to sepsis from infected decubitus ulcer.   Code Status: DNR Family Communication: none at bedside Disposition Plan: pending further management.    Consultants:  Surgery.   Procedures:  Incision and drainage. 2/4  Antibiotics:  Vancomycin 2/4  rocephin   HPI/Subjective:no new complaints.   Objective: Filed Vitals:   07/21/14 1449  BP: 120/57  Pulse: 92  Temp: 98 F (36.7 C)  Resp: 18    Intake/Output Summary (Last 24 hours) at 07/21/14 1939 Last data filed at 07/21/14 1530  Gross per 24 hour  Intake 618.75 ml  Output      0 ml  Net 618.75 ml   Filed Weights   07/20/14 1725  Weight: 79.379 kg (175 lb)    Exam:   General:  Alert afebrile comfortable.   Cardiovascular: s1s2  Respiratory: clear to auscultation, no wheezing or rhonchi  Abdomen: soft non tender non distended bowel sounds heard  Musculoskeletal: no  pedal edema.   Data Reviewed: Basic Metabolic Panel:  Recent Labs Lab 07/20/14 1713 07/21/14 0530  NA 135 136  K 3.7 4.0  CL 98 103  CO2 24 24  GLUCOSE 135* 113*  BUN 67* 59*  CREATININE 2.63* 2.77*  CALCIUM 9.4 8.7   Liver Function Tests:  Recent Labs Lab 07/20/14 1713 07/21/14 0530  AST 73* 54*  ALT 32 30  ALKPHOS 101 84  BILITOT 1.1 0.9  PROT 8.0 6.6  ALBUMIN 3.4* 2.7*   No results for input(s): LIPASE, AMYLASE in the last 168 hours. No results for input(s): AMMONIA in the last 168 hours. CBC:  Recent Labs Lab 07/20/14 1713 07/21/14 0530  WBC 28.3* 28.7*  NEUTROABS 25.5*  --   HGB 13.7 12.6*  HCT 42.2 39.0  MCV 96.1 95.8  PLT 203 181   Cardiac Enzymes: No results for input(s): CKTOTAL, CKMB, CKMBINDEX, TROPONINI in the last 168 hours. BNP (last 3 results) No results for input(s): BNP in the last 8760 hours.  ProBNP (last 3 results) No results for input(s): PROBNP in the last 8760 hours.  CBG: No results for input(s): GLUCAP in the last 168 hours.  Recent Results (from the past 240 hour(s))  Blood Culture (routine x 2)     Status: None (Preliminary result)   Collection Time: 07/20/14  5:12 PM  Result Value Ref Range Status   Specimen Description BLOOD BLOOD LEFT FOREARM  Final   Special Requests BOTTLES DRAWN AEROBIC AND ANAEROBIC 5 CC EA  Final   Culture  Final           BLOOD CULTURE RECEIVED NO GROWTH TO DATE CULTURE WILL BE HELD FOR 5 DAYS BEFORE ISSUING A FINAL NEGATIVE REPORT Performed at [facility]    Report Status PENDING  Incomplete  Blood Culture (routine x 2)     Status: None (Preliminary result)   Collection Time: 07/20/14  5:20 PM  Result Value Ref Range Status   Specimen Description BLOOD LEFT ANTECUBITAL  Final   Special Requests BOTTLES DRAWN AEROBIC AND ANAEROBIC 5CC EACH  Final   Culture   Final           BLOOD CULTURE RECEIVED NO GROWTH TO DATE CULTURE WILL BE HELD FOR 5 DAYS BEFORE ISSUING A FINAL NEGATIVE  REPORT Performed at [facility]    Report Status PENDING  Incomplete  MRSA PCR Screening     Status: None   Collection Time: 07/21/14 12:59 AM  Result Value Ref Range Status   MRSA by PCR NEGATIVE NEGATIVE Final    Comment:        The GeneXpert MRSA Assay (FDA approved for NASAL specimens only), is one component of a comprehensive MRSA colonization surveillance program. It is not intended to diagnose MRSA infection nor to guide or monitor treatment for MRSA infections.      Studies: Ct Abdomen Pelvis Wo Contrast  07/20/2014   CLINICAL DATA:  Sacral decubitus ulceration. Worsening generalized weakness and incontinence. Initial encounter.  EXAM: CT ABDOMEN AND PELVIS WITHOUT CONTRAST  TECHNIQUE: Multidetector CT imaging of the abdomen and pelvis was performed following the standard protocol without IV contrast.  COMPARISON:  CT of the abdomen and pelvis performed 05/24/2002, and renal ultrasound performed 07/06/2007  FINDINGS: Honeycombing and scarring are noted at the visualized lung bases.  A linear 3.5 cm hypodensity within the inferior right hepatic lobe may reflect remote injury or a cyst, increased in size from 2003. Previously noted hepatic cysts appear to have resolved. The spleen is unremarkable. Stones are noted dependently within the gallbladder. The gallbladder is otherwise unremarkable. The pancreas and adrenal glands are unremarkable.  Mild bilateral hydronephrosis is noted. This is thought to reflect marked distention of the bladder, without evidence of a distal obstructing stone. Multiple large right renal cysts are seen, measuring up to 9.1 cm in size. Nonspecific perinephric stranding is noted bilaterally. No nonobstructing renal stones are identified.  No free fluid is identified. The small bowel is unremarkable in appearance. The stomach is within normal limits. No acute vascular abnormalities are seen. Scattered calcification is noted along the abdominal aorta and  its branches.  The appendix is normal in caliber, without evidence for appendicitis. Scattered diverticulosis is noted along the proximal sigmoid colon, and a single diverticulum is noted at the proximal descending colon, without evidence of diverticulitis.  The bladder is significantly distended. The prostate is mildly enlarged, measuring 5.1 cm in transverse dimension, with scattered calcification. No inguinal lymphadenopathy is seen.  There is diffuse soft tissue inflammation about the anorectal canal, slightly more prominent on the right, with a likely associated ulceration along the overlying right-sided perineal soft tissues. Minimal air is noted within the right-sided soft tissues, likely reflect an underlying sinus tract, without a defined abscess. There is mild wall thickening along the distal rectum.  No acute osseous abnormalities are identified. Multilevel vacuum phenomenon is noted at the lower lumbar spine.  IMPRESSION: 1. Diffuse soft tissue inflammation around the anorectal canal, slightly more prominent on the right, with a  likely associated ulceration along the overlying right-sided perineal soft tissues. Minimal air within the right-sided soft tissues likely reflects an underlying sinus tract, without a defined abscess. No evidence of involvement of osseous structures at this time. Mild wall thickening noted along the distal rectum. 2. Mild bilateral hydronephrosis is thought to reflect marked distention of the bladder. No evidence of distal obstructing stone. Multiple large right renal cysts seen. 3. Linear 3.5 cm hypodensity within the right hepatic lobe may reflect remote injury or a cyst. 4. Cholelithiasis; gallbladder otherwise unremarkable in appearance. 5. Scattered diverticulosis along the proximal sigmoid colon, without evidence of diverticulitis. 6. Scattered calcification along the abdominal aorta and its branches.   Electronically Signed   By: Roanna RaiderJeffery  Chang M.D.   On: 07/20/2014 21:06    Ct Head Wo Contrast  07/20/2014   CLINICAL DATA:  Altered mental status.  Dementia  EXAM: CT HEAD WITHOUT CONTRAST  TECHNIQUE: Contiguous axial images were obtained from the base of the skull through the vertex without intravenous contrast.  COMPARISON:  May 12, 2014  FINDINGS: Moderate diffuse atrophy is stable. There is no intracranial mass, hemorrhage, extra-axial fluid collection, or midline shift. 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 There is evidence of a prior infarct at the superior temporal -occipital junction on the right, stable. There is extensive small vessel disease throughout the centra semiovale bilaterally. There is small vessel disease throughout both external capsules. There is no new gray-white compartment lesion. No acute infarct apparent. Bony calvarium appears intact. The mastoid air cells are clear.  IMPRESSION: Atrophy with prior infarcts on the right as well as widespread small vessel disease. No intracranial mass, hemorrhage, or acute appearing infarct.   Electronically Signed   By: Bretta BangWilliam  Woodruff M.D.   On: 07/20/2014 19:19   Dg Chest Port 1 View  07/20/2014   CLINICAL DATA:  Weakness and altered mental status  EXAM: PORTABLE CHEST - 1 VIEW  COMPARISON:  None.  FINDINGS: There is no edema or consolidation. There is fibrotic type change in the periphery of the lungs bilaterally. Heart is mildly enlarged with pulmonary vascularity within normal limits. There is atherosclerotic change in a. No adenopathy. No bone lesions.  IMPRESSION: Peripheral fibrosis in the lungs bilaterally. No frank edema or consolidation. Mild cardiomegaly.   Electronically Signed   By: Bretta BangWilliam  Woodruff M.D.   On: 07/20/2014 17:45    Scheduled Meds: . aspirin  325 mg Oral Daily  . divalproex  125 mg Oral BID  . donepezil  10 mg Oral QHS  . feeding supplement (ENSURE COMPLETE)   237 mL Oral BID BM  . memantine  10 mg Oral BID  . multivitamin with minerals  1 tablet Oral Daily  . piperacillin-tazobactam (ZOSYN)  IV  2.25 g Intravenous 4 times per day  . sodium chloride  3 mL Intravenous Q12H  . [START ON 07/23/2014] vancomycin  1,000 mg Intravenous Q48H   Continuous Infusions:   Principal Problem:   Sepsis due to cellulitis Active Problems:   Sacral decubitus ulcer   Infected decubitus ulcer   Chronic a-fib   Dementia   Essential hypertension    Time spent: 20 minutes.     [facility] HospitalKULA,[name] Fullman  [facility] Pager [phone] If 7PM-7AM, please contact night-coverage at [email or web address], password [email or web address] 07/21/2014, 7:39 PM  LOS: 1 day

## 2014-07-21 NOTE — Procedures (Deleted)
Incision and debridement Procedure Note  Pre-operative Diagnosis: infected sacral decubitus  Post-operative Diagnosis: same  Indications: Darryl Summers is an 81 year old nursing home patient with a history of advanced dementia, atrial fibrillation and hypertension who presented with weakness. I was unable to obtain anything from the patient due to his dementia. He knows he is in the hospital, but unable to provide additional information. His work up shows leukocytosis with WBC of 28.3K, CT of abdomen and pelvis revealed soft tissue inflammation right perianal region. We have therefore been asked to evaluate the patient. He has remained afebrile and hemodynamically stable. Currently on Vanc and zosyn D#2.   Anesthesia: lidocaine 2% with epinephrine   Procedure Details  The procedure, risks and complications have been discussed in detail (including, but not limited to airway compromise, infection, bleeding) with the patient, and the patient has signed consent to the procedure.  The skin was sterilely prepped and draped over the affected area in the usual fashion. After adequate local anesthesia,  I used sharp dissection with scalpel and scissors to remove necrotic tissue to a area 3x3cm in length including skin and subcutaneous tissue. There was purulent drainage and a pocket inferior of the wound.  The patient could not tolerate further debridement.  A significant amount of slough remained.  I aborted further debridement.  I packed the wound with a 4x4.  Hemostasis was achieved with manual pressure and a dry dressing was applied.   The patient was observed until stable.  Findings: Infected decubitus ulcer  EBL: <5 cc's  Drains: none  Condition: Tolerated procedure well   Complications: none.   We will re-evaluate his wound tomorrow.  He may need further debridement in the operating room.    Darryl Summers, ANP-BC

## 2014-07-21 NOTE — Progress Notes (Signed)
OT Cancellation Note  Patient Details Name: Darryl Summers MRN: [id number] DOB: 08/27/1924   Cancelled Treatment:    Reason Eval/Treat Not Completed: Other (comment) Pt adamantly refuses to work on ADL with OT right. He states, "I can do all of it." Pt did not recall working with PT earlier for sitting up EOB. Will reattempt later time.  Lennox LaityStone, [place]  [phone] 07/21/2014, 1:18 PM

## 2014-07-21 NOTE — Progress Notes (Signed)
ANTIBIOTIC CONSULT NOTE   Pharmacy Consult for Vancomycin and Zosyn  Indication: sepsis due to cellulitis  No Known Allergies  Patient Measurements: Height: 5\' 10"  (177.8 cm) Weight: 175 lb (79.379 kg) IBW/kg (Calculated) : 73 Adjusted Body Weight:   Vital Signs: Temp: 98.1 F (36.7 C) (02/04 0525) Temp Source: Oral (02/04 0525) BP: 122/68 mmHg (02/04 0525) Pulse Rate: 91 (02/04 0525) Intake/Output from previous day: 02/03 0701 - 02/04 0700 In: 498.8 [I.V.:398.8; IV Piggyback:100] Out: -  Intake/Output from this shift:    Labs:  Recent Labs  07/20/14 1713 07/21/14 0530  WBC 28.3* 28.7*  HGB 13.7 12.6*  PLT 203 181  CREATININE 2.63* 2.77*   Estimated Creatinine Clearance: 19.4 mL/min (by C-G formula based on Cr of 2.77). No results for input(s): VANCOTROUGH, VANCOPEAK, VANCORANDOM, GENTTROUGH, GENTPEAK, GENTRANDOM, TOBRATROUGH, TOBRAPEAK, TOBRARND, AMIKACINPEAK, AMIKACINTROU, AMIKACIN in the last 72 hours.   Microbiology: Recent Results (from the past 720 hour(s))  Blood Culture (routine x 2)     Status: None (Preliminary result)   Collection Time: 07/20/14  5:12 PM  Result Value Ref Range Status   Specimen Description BLOOD BLOOD LEFT FOREARM  Final   Special Requests BOTTLES DRAWN AEROBIC AND ANAEROBIC 5 CC EA  Final   Culture   Final           BLOOD CULTURE RECEIVED NO GROWTH TO DATE CULTURE WILL BE HELD FOR 5 DAYS BEFORE ISSUING A FINAL NEGATIVE REPORT [facility]    Report Status PENDING  Incomplete  Blood Culture (routine x 2)     Status: None (Preliminary result)   Collection Time: 07/20/14  5:20 PM  Result Value Ref Range Status   Specimen Description BLOOD LEFT ANTECUBITAL  Final   Special Requests BOTTLES DRAWN AEROBIC AND ANAEROBIC 5CC EACH  Final   Culture   Final           BLOOD CULTURE RECEIVED NO GROWTH TO DATE CULTURE WILL BE HELD FOR 5 DAYS BEFORE ISSUING A FINAL NEGATIVE REPORT [facility]    Report Status PENDING  Incomplete  MRSA PCR Screening     Status: None   Collection Time: 07/21/14 12:59 AM  Result Value Ref Range Status   MRSA by PCR NEGATIVE NEGATIVE Final    Comment:        The GeneXpert MRSA Assay (FDA approved for NASAL specimens only), is one component of a comprehensive MRSA colonization surveillance program. It is not intended to diagnose MRSA infection nor to guide or monitor treatment for MRSA infections.     Medical History: Past Medical History  Diagnosis Date  . Atrial fibrillation   . History of stroke   . Hypertension   . Aortic insufficiency   . Moderate mitral regurgitation   . Stroke   . Pernicious anemia   . Peripheral vascular disease   . Peripheral neuropathy     Medications:  Anti-infectives    Start     Dose/Rate Route Frequency Ordered Stop   07/21/14 2200  vancomycin (VANCOCIN) IVPB 750 mg/150 ml premix     750 mg150 mL/hr over 60 Minutes Intravenous Every 24 hours 07/21/14 0515     07/21/14 0100  piperacillin-tazobactam (ZOSYN) IVPB 3.375 g     3.375 g12.5 mL/hr over 240 Minutes Intravenous 3 times per day 07/21/14 0056     07/20/14 2030  vancomycin (VANCOCIN) IVPB 1000 mg/200 mL premix     1,000 mg200 mL/hr over 60 Minutes Intravenous  Once 07/20/14 2026 07/20/14 2203   07/20/14 1830  cefTRIAXone (ROCEPHIN) 1 g in dextrose 5 % 50 mL IVPB     1 g100 mL/hr over 30 Minutes Intravenous  Once 07/20/14 1823 07/20/14 1931     Assessment: 81 yo male with hx of dementia who presents from his nursing facility due to worsening generalized weakness and inability to ambulate.Pt has a sacral stage 2 pressure ulcer that is oozing serosanguinous fluid. Notes state ulcer is necrotic and infected.   2/3 >> zosyn >> 2/3 >> Vanc >>  Tmax: 101.2 WBC: elevated/unchanged Renal: Sr 2.77 (previously 1.5-2.0), CrCl 47ml/min (N) Lactate: 2.65 > 1.93 PCT = 2.87 (2/4)  2/3 blood x 2: NGTD 2/3 urine: pending  Goal of Therapy:  Vancomycin  trough level 15-20 mcg/ml  Zosyn based on renal function   Plan:   Based on renal function:  Change zosyn to 2.25gm IV q6h   Change vancomcyin to  IV x 1 dose today then 1gm IV q48h  Follow renal function and adjust doses as indicated  Juliette Alcide, PharmD, BCPS.   Pager: [phone]  07/21/2014,12:44 PM

## 2014-07-21 NOTE — Progress Notes (Signed)
INITIAL NUTRITION ASSESSMENT  DOCUMENTATION CODES Per approved criteria  -Not Applicable   INTERVENTION: - Multivitamin with minerals daily - Ensure Complete po BID, each supplement provides 350 kcal and 13 grams of protein- supplements must be thickened to nectar-thick consistency - Magic cup TID with meals, each supplement provides 290 kcal and 9 grams of protein - RD will continue to monitor  NUTRITION DIAGNOSIS: Inadequate oral intake related to alzheimer's dementia as evidenced by poor po.   Goal: Pt to meet >/= 90% of their estimated nutrition needs   Monitor:  Weight trend, po intake, acceptance of supplements, labs  Reason for Assessment: Consult for nutrition assessment  81 y.o. male  Admitting Dx: Sepsis due to cellulitis  ASSESSMENT: 81 y.o. male with a past medical history of Alzheimer's dementia, which appears to be moderate to advanced, atrial fibrillation not on anticoagulation, hypertension, who lives in a skilled nursing facility in their memory unit. Apparently for the past few days patient has not been ambulating and has been weaker than normal.   - Pt with infected sacral decubitus ulcer.  Pt with poor memory of recent nutritional intake. Per nurse tech, pt refused both breakfast and lunch. Pt says that he likes chocolate and ice cream. Will send nutritional supplements. Seen by SLP. Current diet is Dysphagia III with nectar-thickened liquids. No wt history in chart. Pt with mild muscle wasting of the temples.   Labs reviewed  Height: Ht Readings from Last 1 Encounters:  07/20/14 5\' 10"  (1.778 m)    Weight: Wt Readings from Last 1 Encounters:  07/20/14 175 lb (79.379 kg)    Ideal Body Weight: 73 kg  % Ideal Body Weight: 109%  Wt Readings from Last 10 Encounters:  07/20/14 175 lb (79.379 kg)    BMI:  Body mass index is 25.11 kg/(m^2).  Estimated Nutritional Needs: Kcal: 2000-2200 Protein: 110-120 g Fluid: 2.0-2.2 L/day  Skin: stage II  wound on sacrum  Diet Order: DIET DYS 3  EDUCATION NEEDS: -No education needs identified at this time   Intake/Output Summary (Last 24 hours) at 07/21/14 1222 Last data filed at 07/21/14 0600  Gross per 24 hour  Intake 498.75 ml  Output      0 ml  Net 498.75 ml    Last BM: 2/4   Labs:   Recent Labs Lab 07/20/14 1713 07/21/14 0530  NA 135 136  K 3.7 4.0  CL 98 103  CO2 24 24  BUN 67* 59*  CREATININE 2.63* 2.77*  CALCIUM 9.4 8.7  GLUCOSE 135* 113*    CBG (last 3)  No results for input(s): GLUCAP in the last 72 hours.  Scheduled Meds: . aspirin  325 mg Oral Daily  . divalproex  125 mg Oral BID  . donepezil  10 mg Oral QHS  . lidocaine-EPINEPHrine  20 mL Intradermal Once  . memantine  10 mg Oral BID  . piperacillin-tazobactam (ZOSYN)  IV  3.375 g Intravenous 3 times per day  . sodium chloride  3 mL Intravenous Q12H  . vancomycin  750 mg Intravenous Q24H    Continuous Infusions: . sodium chloride 75 mL/hr at 07/21/14 0041    Past Medical History  Diagnosis Date  . Atrial fibrillation   . History of stroke   . Hypertension   . Aortic insufficiency   . Moderate mitral regurgitation   . Stroke   . Pernicious anemia   . Peripheral vascular disease   . Peripheral neuropathy     History reviewed.  No pertinent past surgical history.  Emmaline Kluver MS, RD, LDN

## 2014-07-21 NOTE — Progress Notes (Signed)
UR complete 

## 2014-07-21 NOTE — Progress Notes (Signed)
Clinical Social Work Department BRIEF PSYCHOSOCIAL ASSESSMENT 07/21/2014  Patient:  Darryl Summers, Darryl Summers     Account Number:  [id number]     Admit date:  07/20/2014  Clinical Social Worker:  Earlie Server  Date/Time:  07/21/2014 11:45 AM  Referred by:  Physician  Date Referred:  07/21/2014 Referred for  ALF Placement   Other Referral:   Interview type:  Other - See comment Other interview type:   Facility    PSYCHOSOCIAL DATA Living Status:  FACILITY Admitted from facility:  [facility] Level of care:  Assisted Living Primary support name:  Mikki Santee Primary support relationship to patient:  CHILD, ADULT Degree of support available:   Adequate    CURRENT CONCERNS Current Concerns  Post-Acute Placement   Other Concerns:    SOCIAL WORK ASSESSMENT / PLAN CSW received referral due to patient being admitted from a facility. CSW reviewed chart and met with patient at bedside. Patient unable to fully participate in assessment due to dementia.    CSW spoke with Junie Panning at [facility] [address] who reports patient and wife live together at facility. ALF reports that patient has a son who is currently in [place] and a son that lives in [place]. ALF was concerned about decub but family was not agreeable to transfer to hospital until it opened up yesterday. ALF reports that patient had declined over the past 24-48 hours. Patient had been using a rolling walker to ambulate but had been requiring a wheelchair prior to DC to hospital. ALF assists with ADLs such as bathing and dressing.   ALF is agreeable to accept patient back if he is at baseline but report they might need to evaluate to see if a higher level of care is required. CSW will keep Junie Panning [phone]) updated re: DC plans and was informed that CSW can speak with Marita Kansas at ALF as well.    CSW completed FL2 and will continue to follow. CSW will continue trying to get in contact with family members to confirm DC plans as well.    Assessment/plan status:  Psychosocial Support/Ongoing Assessment of Needs Other assessment/ plan:   Information/referral to community resources:   Will return to ALF?    PATIENT'S/FAMILY'S RESPONSE TO PLAN OF CARE: Patient unable to participate in assessment and no family present at bedside. Per ALF, family is all out of town at this time. ALF engaged in assessment and able to provide information re: patient's history and family dynamics. ALF might want to evaluate patient prior to return but is agreeable to accepting patient back if family desires and if patient is at baseline.       [place], [place] [phone]

## 2014-07-21 NOTE — Evaluation (Signed)
Clinical/Bedside Swallow Evaluation Patient Details  Name: Darryl Summers MRN: [id number] Date of Birth: 10-30-1924  Today's Date: 07/21/2014 Time: SLP Start Time (ACUTE ONLY): 1015 SLP Stop Time (ACUTE ONLY): 1030 SLP Time Calculation (min) (ACUTE ONLY): 15 min  Past Medical History:  Past Medical History  Diagnosis Date  . Atrial fibrillation   . History of stroke   . Hypertension   . Aortic insufficiency   . Moderate mitral regurgitation   . Stroke   . Pernicious anemia   . Peripheral vascular disease   . Peripheral neuropathy    Past Surgical History: History reviewed. No pertinent past surgical history. HPI:  81 y.o. male with a past medical history of Alzheimer's dementia, which appears to be moderate to advanced, atrial fibrillation not on anticoagulation, hypertension, who lives in a skilled nursing facility in their memory unit.  Admitted with sepsis due to infected sacral decubitus.  SNF records indicate thin liquids ordered; no information re: diet consistency.  Assessment / Plan / Recommendation Clinical Impression  Pt presents with s/s of dysphagia with concerns for aspiration.  No prior records available that indicate a hx of dysphagia, so baseline is unknown and pt unable to provide hx. Pt with adequate mastication, but multiple audible swallows are observed with single boluses.  Thin liquids elicit immediate and consistent coughing, suggestive of aspiration. For now, recommend a dysphagia 3 diet with nectar-thick liquids; meds whole in puree.  SLP f/u to address toleration/safety.  Benefit of instrumental swallow study to be determined.      Aspiration Risk  Moderate    Diet Recommendation Dysphagia 3 (Mechanical Soft);Nectar-thick liquid   Liquid Administration via: Cup;No straw Medication Administration: Whole meds with puree Supervision: Intermittent supervision to cue for compensatory strategies Compensations: Slow rate;Small sips/bites Postural Changes  and/or Swallow Maneuvers: Seated upright 90 degrees;Upright 30-60 min after meal    Other  Recommendations Oral Care Recommendations: Oral care BID Other Recommendations: Order thickener from pharmacy   Follow Up Recommendations   (tba)    Frequency and Duration min 3x week  2 weeks       SLP Swallow Goals  Swallow Study Prior Functional Status       General Date of Onset: 07/20/14 HPI: 81 y.o. male with a past medical history of Alzheimer's dementia, which appears to be moderate to advanced, atrial fibrillation not on anticoagulation, hypertension, who lives in a skilled nursing facility in their memory unit.  Admitted with sepsis due to infected sacral decubitus.  Type of Study: Bedside swallow evaluation Previous Swallow Assessment: none per records Diet Prior to this Study:  (clear liquids) Temperature Spikes Noted: No Respiratory Status: Room air History of Recent Intubation: No Behavior/Cognition: Alert;Cooperative;Pleasant mood;Confused Oral Cavity - Dentition: Adequate natural dentition Self-Feeding Abilities: Able to feed self Patient Positioning: Upright in bed Baseline Vocal Quality: Clear Volitional Cough: Strong Volitional Swallow: Able to elicit    Oral/Motor/Sensory Function Overall Oral Motor/Sensory Function: Appears within functional limits for tasks assessed   Ice Chips Ice chips: Within functional limits Presentation: Spoon   Thin Liquid Thin Liquid: Impaired Presentation: Cup;Straw Pharyngeal  Phase Impairments: Multiple swallows;Cough - Immediate    Nectar Thick Nectar Thick Liquid: Impaired Presentation: Self Fed;Cup Pharyngeal Phase Impairments: Multiple swallows   Honey Thick Honey Thick Liquid: Not tested   Puree Puree: Within functional limits Presentation: Self Fed;Spoon   Solid  Zyquan Crotty L. [place], [place] CCC/SLP Pager [phone]     Solid: Within functional limits Presentation: Self Fed  Blenda MountsCouture, Joseantonio Dittmar Laurice 07/21/2014,10:42  AM

## 2014-07-21 NOTE — Consult Note (Signed)
WOC wound consult note Reason for Consult:Sacral pressure ulcer.  MD had simultaneously consulted CCS and bedside wound debridement has been performed.Surgery will see tomorrow and determine is further debridement will be needed.  IF that is the case, will be done in OR due to patient discomfort.  CCS will manage wound care. WOC Nursing will sign off. WOC nursing team will not follow, but will remain available to this patient, the nursing and medical team.  Please re-consult if needed. Thanks, Ladona MowLaurie Dene Nazir, MSN, RN, GNP, [place], CWON-AP [phone])

## 2014-07-22 DIAGNOSIS — N179 Acute kidney failure, unspecified: Secondary | ICD-10-CM

## 2014-07-22 DIAGNOSIS — N189 Chronic kidney disease, unspecified: Secondary | ICD-10-CM

## 2014-07-22 DIAGNOSIS — L8995 Pressure ulcer of unspecified site, unstageable: Secondary | ICD-10-CM

## 2014-07-22 LAB — CBC
HCT: 38.8 % — ABNORMAL LOW (ref 39.0–52.0)
Hemoglobin: 12.7 g/dL — ABNORMAL LOW (ref 13.0–17.0)
MCH: 31.1 pg (ref 26.0–34.0)
MCHC: 32.7 g/dL (ref 30.0–36.0)
MCV: 95.1 fL (ref 78.0–100.0)
Platelets: 185 10*3/uL (ref 150–400)
RBC: 4.08 MIL/uL — AB (ref 4.22–5.81)
RDW: 15 % (ref 11.5–15.5)
WBC: 25.5 10*3/uL — ABNORMAL HIGH (ref 4.0–10.5)

## 2014-07-22 LAB — BASIC METABOLIC PANEL
ANION GAP: 8 (ref 5–15)
BUN: 61 mg/dL — AB (ref 6–23)
CO2: 24 mmol/L (ref 19–32)
CREATININE: 2.76 mg/dL — AB (ref 0.50–1.35)
Calcium: 8.9 mg/dL (ref 8.4–10.5)
Chloride: 106 mmol/L (ref 96–112)
GFR calc Af Amer: 22 mL/min — ABNORMAL LOW (ref >90)
GFR calc non Af Amer: 19 mL/min — ABNORMAL LOW (ref >90)
Glucose, Bld: 97 mg/dL (ref 70–99)
Potassium: 3.7 mmol/L (ref 3.5–5.1)
Sodium: 138 mmol/L (ref 135–145)

## 2014-07-22 LAB — PROCALCITONIN: Procalcitonin: 2.31 ng/mL

## 2014-07-22 MED ORDER — SODIUM CHLORIDE 0.9 % IV SOLN: INTRAVENOUS | Status: DC

## 2014-07-22 MED ORDER — SODIUM CHLORIDE 0.9 % IV SOLN
INTRAVENOUS | Status: DC
  Administered 2014-07-22: 21:00:00 via INTRAVENOUS

## 2014-07-22 NOTE — Progress Notes (Addendum)
PROGRESS NOTE    Darryl Summers ZOX:[id number] DOB: 27-Jan-1925 DOA: 07/20/2014 PCP: No primary care provider on file.  HPI/Brief narrative 81 year old male patient, SNF resident, with history of moderate to advanced Alzheimer's disease, A. fib not on anticoagulation, HTN, stroke, presented to the ED with weakness and found to have sepsis secondary to infected sacral decubitus ulcers. Surgery consulted. Patient is status post I&D on 07/21/14 with plans for further debridement in or possibly on Monday.   Assessment/Plan:  1. Sepsis, present on admission: Secondary to infected sacral decubitus ulcers. Treated with IV fluid hydration and antibiotics. Sepsis physiology have improved. Blood cultures 2: Negative to date. Urine culture negative. Sacral wound culture-pending. 2. Necrotic/Infected sacral decubitus ulcer: CT results as below. Surgery follow-up appreciated. Status post bedside I and D 07/21/14 with plans for further debridement possibly on 07/25/14. Continue IV vancomycin and Zosyn. Patient does not recollect yesterday's procedure. 3. Chronic atrial fibrillation: Not on rate control medications PTA. Not on anticoagulation despite high CHA2DS2- VASc score 5, presumably due to his dementia.  4. Acute on stage III chronic kidney disease: Last creatinine 05/12/14:1.5-possibly his baseline. Acute renal failure secondary to sepsis, and mild bilateral hydronephrosis noted on CT abdomen. Foley catheter placed. Hydrate with IV fluids. Avoid nephrotoxic medications. Follow BMP closely. No reported hypotension or NSAID use. 5. Advanced Alzheimer's disease: Pleasantly confused but no behavioral disturbances. Continue Aricept and Namenda. Mental status at baseline. Continue divalproex. 6. Essential hypertension: Controlled without home medications. Monitor. 7. Incidental cholelithiasis: Noted on CT. Patient asymptomatic. 8. Leukocytosis: Secondary to problem #1 and 2. Gradually improving. Follow CBCs.    9. Mild bilateral hydronephrosis/urinary retention: Seen on CT. Possibly from urinary retention? Related to BPH. Continue Foley catheter for now to avoid soiling of necrotic/infected sacral wound.    Code Status: DNR. Confirmed with family by Myra Rude, Surgery ANP-BC.  Family Communication: None at bedside. Disposition Plan: Return to SNF memory care unit when medically stable.   Consultants:  General surgery  Procedures:  Bedside debridement of necrotic/infected sacral wound on 07/21/14  Foley catheter  Antibiotics:  IV vancomycin 07/20/14  IV Zosyn 07/20/14   Subjective: Patient pleasantly confused. Denies pain or any other complaints. Does not know why he is in the hospital and does not recollect having a surgical procedure done yesterday.  Objective: Filed Vitals:   07/22/14 0533 07/22/14 0832 07/22/14 0949 07/22/14 1348  BP: 118/74 108/58 124/64 115/62  Pulse: 73 79 78 80  Temp: 98.2 F (36.8 C)   98.3 F (36.8 C)  TempSrc: Oral   Oral  Resp: Height:      Weight:      SpO2: 98%  100% 100%    Intake/Output Summary (Last 24 hours) at 07/22/14 1403 Last data filed at 07/22/14 0540  Gross per 24 hour  Intake    320 ml  Output   2450 ml  Net  -2130 ml   Filed Weights   07/20/14 1725  Weight: 79.379 kg (175 lb)     Exam:  General exam: Pleasant elderly male lying comfortably supine in bed.  Respiratory system: Clear. No increased work of breathing. Cardiovascular system: S1 & S2 heard, RRR. No JVD, murmurs, gallops, clicks or pedal edema.Telemetry: Atrial fibrillation with controlled ventricular rate.  Gastrointestinal system: Abdomen is nondistended, soft and nontender. Normal bowel sounds heard. Foley +  Central nervous system: Alert and oriented to self and partly to place. No focal neurological deficits. Follows instructions.  Extremities: Symmetric 5 x 5 power. Skin: Dressing over sacral decubitus, clean, dry and intact.   Data  Reviewed: Basic Metabolic Panel:  Recent Labs Lab 07/20/14 1713 07/21/14 0530 07/22/14 0542  NA 135 136 138  K 3.7 4.0 3.7  CL 98 103 106  CO2 24 24 24   GLUCOSE 135* 113* 97  BUN 67* 59* 61*  CREATININE 2.63* 2.77* 2.76*  CALCIUM 9.4 8.7 8.9   Liver Function Tests:  Recent Labs Lab 07/20/14 1713 07/21/14 0530  AST 73* 54*  ALT 32 30  ALKPHOS 101 84  BILITOT 1.1 0.9  PROT 8.0 6.6  ALBUMIN 3.4* 2.7*   No results for input(s): LIPASE, AMYLASE in the last 168 hours. No results for input(s): AMMONIA in the last 168 hours. CBC:  Recent Labs Lab 07/20/14 1713 07/21/14 0530 07/22/14 0542  WBC 28.3* 28.7* 25.5*  NEUTROABS 25.5*  --   --   HGB 13.7 12.6* 12.7*  HCT 42.2 39.0 38.8*  MCV 96.1 95.8 95.1  PLT 203 181 185   Cardiac Enzymes: No results for input(s): CKTOTAL, CKMB, CKMBINDEX, TROPONINI in the last 168 hours. BNP (last 3 results) No results for input(s): PROBNP in the last 8760 hours. CBG: No results for input(s): GLUCAP in the last 168 hours.  Recent Results (from the past 240 hour(s))  Blood Culture (routine x 2)     Status: None (Preliminary result)   Collection Time: 07/20/14  5:12 PM  Result Value Ref Range Status   Specimen Description BLOOD BLOOD LEFT FOREARM  Final   Special Requests BOTTLES DRAWN AEROBIC AND ANAEROBIC 5 CC EA  Final   Culture   Final           BLOOD CULTURE RECEIVED NO GROWTH TO DATE CULTURE WILL BE HELD FOR 5 DAYS BEFORE ISSUING A FINAL NEGATIVE REPORT [facility]    Report Status PENDING  Incomplete  Blood Culture (routine x 2)     Status: None (Preliminary result)   Collection Time: 07/20/14  5:20 PM  Result Value Ref Range Status   Specimen Description BLOOD LEFT ANTECUBITAL  Final   Special Requests BOTTLES DRAWN AEROBIC AND ANAEROBIC 5CC EACH  Final   Culture   Final           BLOOD CULTURE RECEIVED NO GROWTH TO DATE CULTURE WILL BE HELD FOR 5 DAYS BEFORE ISSUING A FINAL NEGATIVE  REPORT Performed at [facility]    Report Status PENDING  Incomplete  Urine culture     Status: None   Collection Time: 07/20/14  6:16 PM  Result Value Ref Range Status   Specimen Description URINE, CATHETERIZED  Final   Special Requests NONE  Final   Colony Count NO GROWTH Performed at [facility]   Final   Culture NO GROWTH Performed at [facility]   Final   Report Status 07/21/2014 FINAL  Final  MRSA PCR Screening     Status: None   Collection Time: 07/21/14 12:59 AM  Result Value Ref Range Status   MRSA by PCR NEGATIVE NEGATIVE Final    Comment:        The GeneXpert MRSA Assay (FDA approved for NASAL specimens only), is one component of a comprehensive MRSA colonization surveillance program. It is not intended to diagnose MRSA infection nor to guide or monitor treatment for MRSA infections.   Wound culture     Status: None (Preliminary result)   Collection Time: 07/21/14  1:17  PM  Result Value Ref Range Status   Specimen Description SACRAL  Final   Special Requests Normal  Final   Gram Stain   Final    FEW WBC PRESENT,BOTH PMN AND MONONUCLEAR NO SQUAMOUS EPITHELIAL CELLS SEEN MODERATE GRAM POSITIVE COCCI IN PAIRS IN CLUSTERS Performed at [facility]    Culture   Final    Culture reincubated for better growth Performed at [facility]    Report Status PENDING  Incomplete           Studies: Ct Abdomen Pelvis Wo Contrast  07/20/2014   CLINICAL DATA:  Sacral decubitus ulceration. Worsening generalized weakness and incontinence. Initial encounter.  EXAM: CT ABDOMEN AND PELVIS WITHOUT CONTRAST  TECHNIQUE: Multidetector CT imaging of the abdomen and pelvis was performed following the standard protocol without IV contrast.  COMPARISON:  CT of the abdomen and pelvis performed 05/24/2002, and renal ultrasound performed 07/06/2007  FINDINGS: Honeycombing and scarring are noted at the visualized lung bases.  A linear 3.5 cm  hypodensity within the inferior right hepatic lobe may reflect remote injury or a cyst, increased in size from 2003. Previously noted hepatic cysts appear to have resolved. The spleen is unremarkable. Stones are noted dependently within the gallbladder. The gallbladder is otherwise unremarkable. The pancreas and adrenal glands are unremarkable.  Mild bilateral hydronephrosis is noted. This is thought to reflect marked distention of the bladder, without evidence of a distal obstructing stone. Multiple large right renal cysts are seen, measuring up to 9.1 cm in size. Nonspecific perinephric stranding is noted bilaterally. No nonobstructing renal stones are identified.  No free fluid is identified. The small bowel is unremarkable in appearance. The stomach is within normal limits. No acute vascular abnormalities are seen. Scattered calcification is noted along the abdominal aorta and its branches.  The appendix is normal in caliber, without evidence for appendicitis. Scattered diverticulosis is noted along the proximal sigmoid colon, and a single diverticulum is noted at the proximal descending colon, without evidence of diverticulitis.  The bladder is significantly distended. The prostate is mildly enlarged, measuring 5.1 cm in transverse dimension, with scattered calcification. No inguinal lymphadenopathy is seen.  There is diffuse soft tissue inflammation about the anorectal canal, slightly more prominent on the right, with a likely associated ulceration along the overlying right-sided perineal soft tissues. Minimal air is noted within the right-sided soft tissues, likely reflect an underlying sinus tract, without a defined abscess. There is mild wall thickening along the distal rectum.  No acute osseous abnormalities are identified. Multilevel vacuum phenomenon is noted at the lower lumbar spine.  IMPRESSION: 1. Diffuse soft tissue inflammation around the anorectal canal, slightly more prominent on the right, with  a likely associated ulceration along the overlying right-sided perineal soft tissues. Minimal air within the right-sided soft tissues likely reflects an underlying sinus tract, without a defined abscess. No evidence of involvement of osseous structures at this time. Mild wall thickening noted along the distal rectum. 2. Mild bilateral hydronephrosis is thought to reflect marked distention of the bladder. No evidence of distal obstructing stone. Multiple large right renal cysts seen. 3. Linear 3.5 cm hypodensity within the right hepatic lobe may reflect remote injury or a cyst. 4. Cholelithiasis; gallbladder otherwise unremarkable in appearance. 5. Scattered diverticulosis along the proximal sigmoid colon, without evidence of diverticulitis. 6. Scattered calcification along the abdominal aorta and its branches.   Electronically Signed   By: Roanna Raider M.D.   On: 07/20/2014 21:06  Ct Head Wo Contrast  07/20/2014   CLINICAL DATA:  Altered mental status.  Dementia  EXAM: CT HEAD WITHOUT CONTRAST  TECHNIQUE: Contiguous axial images were obtained from the base of the skull through the vertex without intravenous contrast.  COMPARISON:  May 12, 2014  FINDINGS: Moderate diffuse atrophy is stable. There is no intracranial mass, hemorrhage, extra-axial fluid collection, or midline shift. 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 There is evidence of a prior infarct at the superior temporal -occipital junction on the right, stable. There is extensive small vessel disease throughout the centra semiovale bilaterally. There is small vessel disease throughout both external capsules. There is no new gray-white compartment lesion. No acute infarct apparent. Bony calvarium appears intact. The mastoid air cells are clear.  IMPRESSION: Atrophy with prior infarcts on the right as well as widespread small vessel  disease. No intracranial mass, hemorrhage, or acute appearing infarct.   Electronically Signed   By: Bretta Bang M.D.   On: 07/20/2014 19:19   Dg Chest Port 1 View  07/20/2014   CLINICAL DATA:  Weakness and altered mental status  EXAM: PORTABLE CHEST - 1 VIEW  COMPARISON:  None.  FINDINGS: There is no edema or consolidation. There is fibrotic type change in the periphery of the lungs bilaterally. Heart is mildly enlarged with pulmonary vascularity within normal limits. There is atherosclerotic change in a. No adenopathy. No bone lesions.  IMPRESSION: Peripheral fibrosis in the lungs bilaterally. No frank edema or consolidation. Mild cardiomegaly.   Electronically Signed   By: Bretta Bang M.D.   On: 07/20/2014 17:45        Scheduled Meds: . aspirin  325 mg Oral Daily  . divalproex  125 mg Oral BID  . donepezil  10 mg Oral QHS  . feeding supplement (ENSURE COMPLETE)  237 mL Oral BID BM  . memantine  10 mg Oral BID  . multivitamin with minerals  1 tablet Oral Daily  . piperacillin-tazobactam (ZOSYN)  IV  2.25 g Intravenous 4 times per day  . sodium chloride  3 mL Intravenous Q12H  . [START ON 07/23/2014] vancomycin  1,000 mg Intravenous Q48H   Continuous Infusions: . sodium chloride 75 mL/hr at 07/22/14 1610    Principal Problem:   Sepsis due to cellulitis Active Problems:   Sacral decubitus ulcer   Infected decubitus ulcer   Chronic a-fib   Dementia   Essential hypertension    Time spent: 45 minutes.   Marcellus Scott, MD, FACP, FHM. [facility] Pager [phone]  If 7PM-7AM, please contact night-coverage [email or web address] Password [email or web address] 07/22/2014, 2:03 PM    LOS: 2 days

## 2014-07-22 NOTE — Clinical Documentation Improvement (Signed)
  MD's, NP's, and PA's  Documentation of "CKD" noted if stage known, please add to chart.  GFR 19 / 20. Thank you    Possible Clinical Conditions?   CKD Stage III - GFR 30-59  CKD Stage IV - GFR 15-29  CKD Stage V - GFR < 15  Other condition  Cannot Clinically determine     Risk Factors: Sepsis, Severe Sepsis , Dementia, Acute renal Failure, Urine retention  Diagnostics:  CMET: GFR 19 ,  20  Treatment: NS IV bolus total 1500 ml, IVF @ 75 ml / hour   Thank You, Lavonda JumboLawanda J Kahmya Pinkham ,RN Clinical Documentation Specialist:  [phone]  [facility]- Health Information Management

## 2014-07-22 NOTE — Progress Notes (Signed)
Speech Language Pathology Treatment: Dysphagia  Patient Details Name: Darryl Summers MRN: [id number] DOB: 12/30/1924 Today's Date: 07/22/2014 Time: [id number] SLP Time Calculation (min) (ACUTE ONLY): 14 min  Assessment / Plan / Recommendation Clinical Impression  Pt appears to be tolerating Dysphagia 3 diet with Nectar thick liquids.  Pt does exhibit an intermittent delayed throat  clear after sips of nectar thick liquid.  Pt states, "I do that all the time.  It's nothing."  Pt is to have surgery on Monday.  SLP will f/u with pt after surgery to assess for ongoing diet toleration and to determine if an objective study (MBS) is indicated prior to d/c.   HPI HPI: 81 y.o. male with a past medical history of Alzheimer's dementia, which appears to be moderate to advanced, atrial fibrillation not on anticoagulation, hypertension, who lives in a skilled nursing facility in their memory unit.  Admitted with sepsis due to infected sacral decubitus.    Pertinent Vitals Pain Assessment: No/denies pain Pain Score: 0-No pain  SLP Plan  Continue with current plan of care ([facility] after he has surgery)    Recommendations Diet recommendations: Dysphagia 3 (mechanical soft);Nectar-thick liquid Liquids provided via: Cup Medication Administration: Whole meds with puree Supervision: Intermittent supervision to cue for compensatory strategies Compensations: Slow rate;Small sips/bites Postural Changes and/or Swallow Maneuvers: Seated upright 90 degrees;Upright 30-60 min after meal              Plan: Continue with current plan of care ([facility])    GO     [name], [name] 07/22/2014, 2:49 PM

## 2014-07-22 NOTE — Evaluation (Signed)
Occupational Therapy Evaluation Patient Details Name: Darryl Summers MRN: [id number] DOB: 1924-10-18 Today's Date: 07/22/2014    History of Present Illness Garold Rhen Kawecki is a 81 y.o. male adm with sacral decubitus, sepsis;  I & D performed 07/21/14.   past medical history:Alzheimer's dementia--moderate to advanced, atrial fibrillation, hypertension. pt resides in memory unit., was ambulatory at baseline and performed ADLS with stand by assistance   Clinical Impression   Pt was admitted for the above.  Pt needed stand by assist for adls prior to admission.  He now needs A x 2 (max to total) for LB adls and needs min to max for grooming and UB adls.  Will follow in acute focusing on UB ADLs and toilet transfers.    Follow Up Recommendations  SNF    Equipment Recommendations  3 in 1 bedside comode    Recommendations for Other Services       Precautions / Restrictions Precautions Precautions: Fall Restrictions Weight Bearing Restrictions: No      Mobility Bed Mobility         Supine to sit: Mod assist;HOB elevated Sit to supine: Mod assist   General bed mobility comments: assist for trunk and cues for sequence and attention to task; pt requires +2 total assist to scoot up in bed in supine  Transfers                 General transfer comment: not tested    Balance     Sitting balance-Leahy Scale: Poor Sitting balance - Comments: min guard to min A to sit eob for one minute                                    ADL Overall ADL's : Needs assistance/impaired Eating/Feeding: Set up;Bed level (HOB raise; Dys III and nectar diet)   Grooming: Brushing hair;Minimal assistance;Sitting           Upper Body Dressing : Maximal assistance;Bed level                     General ADL Comments: pt needed mod A to sit eob; only sat for about 1 minute before putting leg back up on bed.  Need A x 2 for safety for standing:  did not attempt.  Pt  had some confusion, reaching for catheter, taking arm out of gown.  Needs A x 2 (max to total) for LB adls and cues for all activities     Vision                     Perception     Praxis      Pertinent Vitals/Pain Pain Assessment: Faces Pain Score: 0-No pain     Hand Dominance Right   Extremity/Trunk Assessment Upper Extremity Assessment Upper Extremity Assessment: Generalized weakness           Communication Communication Communication: HOH   Cognition Arousal/Alertness: Awake/alert Behavior During Therapy: Flat affect Overall Cognitive Status: History of cognitive impairments - at baseline                     General Comments       Exercises       Shoulder Instructions      Home Living Family/patient expects to be discharged to:: Skilled nursing facility  Prior Functioning/Environment Level of Independence: Needs assistance        Comments: per chart, stand by assistance for adls and mobility    OT Diagnosis: Generalized weakness;Cognitive deficits   OT Problem List: Decreased strength;Decreased activity tolerance;Impaired balance (sitting and/or standing);Decreased cognition   OT Treatment/Interventions: Self-care/ADL training;DME and/or AE instruction;Patient/family education;Balance training;Therapeutic activities;Cognitive remediation/compensation    OT Goals(Current goals can be found in the care plan section) Acute Rehab OT Goals Patient Stated Goal: pt unable to state OT Goal Formulation: Patient unable to participate in goal setting Potential to Achieve Goals: Fair ADL Goals Pt Will Perform Grooming: with min assist;sitting (2 tasks with mod multimodal cues) Pt Will Perform Upper Body Bathing: with min assist;sitting (mod multimodal cues) Pt Will Perform Upper Body Dressing: with min assist;sitting (mod multimodal cues) Pt Will Transfer to Toilet: with min assist;with  +2 assist;bedside commode;stand pivot transfer (mod multimodal cues) Additional ADL Goal #1: pt will maintain attention to task for 1 minute without redirection  OT Frequency: Min 2X/week   Barriers to D/C:            Co-evaluation              End of Session    Activity Tolerance: Patient limited by fatigue Patient left: in bed;with call bell/phone within reach;with bed alarm set   Time: 1115-1131 OT Time Calculation (min): 16 min Charges:  OT General Charges $OT Visit: 1 Procedure OT Evaluation $Initial OT Evaluation Tier I: 1 Procedure G-Codes:    Cheyenne Bordeaux 07/22/2014, 12:10 PM  Marica OtterMaryellen Cordell Coke, OTR/L [phone] 07/22/2014

## 2014-07-22 NOTE — Progress Notes (Addendum)
Patient ID: Darryl Summers, male   DOB: 10/07/1924, 81 y.o.   MRN: [id number]  I spoke with Darryl Summers(patients son) regarding surgical recommendations.  He wishes to proceed with debridement under anesthesia.  We discussed risks of the procedure including but not limited to infection, bleeding, further need for debridements, poor wound healing, anesthesia risks including heart attack, stroke.    [place] is traveling back from [place] and wont be available for consent.  He plans to visit tomorrow morning and we can obtain it then.  No definite surgery date has been set, likely Monday.    Darryl Summers, ANP-BC  ADDENDUM: Darryl spoke to the patient's son in depth about his dad's situation and about his code status.  It was desired for the patient to be full DNR.  This was relayed to the primary physician who has made adjustments in the computer.  Darryl Summers E

## 2014-07-22 NOTE — Progress Notes (Addendum)
Clinical Social Work  CSW was on 5E unit and son called to get update on patient. Son reports he and wife remain in [place] but plan on returning late today. Son reports that he is able to talk for a few minutes. Son confirms that [facility] and understands that he might not be able to return due to needing a higher level of care. Son reports that patient's wife has been to [facility] in the past and that would be there first preference for placement if SNF is needed. Son asked that CSW speak with [facility] to determine if they can accept him back and if not then he would prefer placement at [facility]. CSW left a message with [facility] to discuss patient's plans. CSW explained to son that PT has evaluated patient and recommended SNF so ALF would most likely agree with recommendations. [place] CSW spoke with [facility] who reports they are out of network so benefits would only cover 80/20%. CSW will ask weekend CSW to follow up with son once he is back in town in order to discuss if they want [facility] or go with another facility.   Son thanked CSW for call. Son reports he is trying to be supportive but it is difficult when [place] is [place] and has limited access to call and speak with staff. Son reports that he will feel more confident about patient's care once he is back at home and can visit patient face to face.  CSW will continue to follow.  Unk LightningHolly Irwin Toran, Kentucky[place]CSW [phone]  Addendum [phone] CSW received return call from ALF who reports that patient will need SNF due to increase in needs. ALF reports that they feel SNF will be best option to ensure patient's safety.

## 2014-07-22 NOTE — Progress Notes (Signed)
Patient ID: Darryl Summers, male   DOB: 1925-04-02, 81 y.o.   MRN: [id number]     [facility]      [address] [place], [place] [address]    Phone: [phone] FAX: [phone]     Subjective: Pt confused, pleasant.  Does not recall i&d yesterday.   Objective:  Vital signs:  Filed Vitals:   07/21/14 1449 07/21/14 2129 07/22/14 0100 07/22/14 0533  BP: 120/57 117/64 106/66 118/74  Pulse: 92 75 72 73  Temp: 98 F (36.7 C) 98.4 F (36.9 C)  98.2 F (36.8 C)  TempSrc: Oral Oral  Oral  Resp: _0 Height:      Weight:      SpO2: 100% 100% 99% 98%    Last BM Date: 07/20/14  Intake/Output   Yesterday:  02/04 0701 - 02/05 0700 In: 320 [P.O.:120; IV Piggyback:200] Out: 2450 [Urine:2450] This shift: I/O last 3 completed shifts: In: 818.8 [P.O.:120; I.V.:398.8; IV Piggyback:300] Out: 2450 [Urine:2450]      Physical Exam: General: Pt awake/alert/oriented x1 no acute distress Skin: buttocks-white/yellow slough noted throughout the wound bed, mild surrounding erythema, approx 7cm undermining inferior part of the wound that will be difficult for the nurse to pack.     Problem List:   Principal Problem:   Sepsis due to cellulitis Active Problems:   Sacral decubitus ulcer   Infected decubitus ulcer   Chronic a-fib   Dementia   Essential hypertension    Results:   Labs: Results for orders placed or performed during the hospital encounter of 07/20/14 (from the past 48 hour(s))  Blood Culture (routine x 2)     Status: None (Preliminary result)   Collection Time: 07/20/14  5:12 PM  Result Value Ref Range   Specimen Description BLOOD BLOOD LEFT FOREARM    Special Requests BOTTLES DRAWN AEROBIC AND ANAEROBIC 5 CC EA    Culture             BLOOD CULTURE RECEIVED NO GROWTH TO DATE CULTURE WILL BE HELD FOR 5 DAYS BEFORE ISSUING A FINAL NEGATIVE REPORT Performed at [facility]    Report Status PENDING    CBC WITH DIFFERENTIAL     Status: Abnormal   Collection Time: 07/20/14  5:13 PM  Result Value Ref Range   WBC 28.3 (H) 4.0 - 10.5 K/uL   RBC 4.39 4.22 - 5.81 MIL/uL   Hemoglobin 13.7 13.0 - 17.0 g/dL   HCT 42.2 39.0 - 52.0 %   MCV 96.1 78.0 - 100.0 fL   MCH 31.2 26.0 - 34.0 pg   MCHC 32.5 30.0 - 36.0 g/dL   RDW 14.7 11.5 - 15.5 %   Platelets 203 150 - 400 K/uL   Neutrophils Relative % 90 (H) 43 - 77 %   Lymphocytes Relative 3 (L) 12 - 46 %   Monocytes Relative 7 3 - 12 %   Eosinophils Relative 0 0 - 5 %   Basophils Relative 0 0 - 1 %   Neutro Abs 25.5 (H) 1.7 - 7.7 K/uL   Lymphs Abs 0.8 0.7 - 4.0 K/uL   Monocytes Absolute 2.0 (H) 0.1 - 1.0 K/uL   Eosinophils Absolute 0.0 0.0 - 0.7 K/uL   Basophils Absolute 0.0 0.0 - 0.1 K/uL   WBC Morphology MILD LEFT SHIFT (1-5% METAS, OCC MYELO, OCC BANDS)     Comment: VACUOLATED NEUTROPHILS  Comprehensive metabolic panel  Status: Abnormal   Collection Time: 07/20/14  5:13 PM  Result Value Ref Range   Sodium 135 135 - 145 mmol/L   Potassium 3.7 3.5 - 5.1 mmol/L   Chloride 98 96 - 112 mmol/L   CO2 24 19 - 32 mmol/L   Glucose, Bld 135 (H) 70 - 99 mg/dL   BUN 67 (H) 6 - 23 mg/dL   Creatinine, Ser 2.63 (H) 0.50 - 1.35 mg/dL   Calcium 9.4 8.4 - 10.5 mg/dL   Total Protein 8.0 6.0 - 8.3 g/dL   Albumin 3.4 (L) 3.5 - 5.2 g/dL   AST 73 (H) 0 - 37 U/L   ALT 32 0 - 53 U/L   Alkaline Phosphatase 101 39 - 117 U/L   Total Bilirubin 1.1 0.3 - 1.2 mg/dL   GFR calc non Af Amer 20 (L) >90 mL/min   GFR calc Af Amer 24 (L) >90 mL/min    Comment: (NOTE) The eGFR has been calculated using the CKD EPI equation. This calculation has not been validated in all clinical situations. eGFR's persistently <90 mL/min signify possible Chronic Kidney Disease.    Anion gap 13 5 - 15  I-Stat CG4 Lactic Acid, ED (not at [facility])     Status: Abnormal   Collection Time: 07/20/14  5:19 PM  Result Value Ref Range   Lactic Acid, Venous 2.65 (HH) 0.5 - 2.0 mmol/L    Comment NOTIFIED PHYSICIAN   Blood Culture (routine x 2)     Status: None (Preliminary result)   Collection Time: 07/20/14  5:20 PM  Result Value Ref Range   Specimen Description BLOOD LEFT ANTECUBITAL    Special Requests BOTTLES DRAWN AEROBIC AND ANAEROBIC 5CC EACH    Culture             BLOOD CULTURE RECEIVED NO GROWTH TO DATE CULTURE WILL BE HELD FOR 5 DAYS BEFORE ISSUING A FINAL NEGATIVE REPORT Performed at [facility]    Report Status PENDING   Urinalysis, Routine w reflex microscopic     Status: Abnormal   Collection Time: 07/20/14  6:16 PM  Result Value Ref Range   Color, Urine YELLOW YELLOW   APPearance CLEAR CLEAR   Specific Gravity, Urine 1.019 1.005 - 1.030   pH 5.5 5.0 - 8.0   Glucose, UA NEGATIVE NEGATIVE mg/dL   Hgb urine dipstick MODERATE (A) NEGATIVE   Bilirubin Urine NEGATIVE NEGATIVE   Ketones, ur NEGATIVE NEGATIVE mg/dL   Protein, ur 30 (A) NEGATIVE mg/dL   Urobilinogen, UA 0.2 0.0 - 1.0 mg/dL   Nitrite NEGATIVE NEGATIVE   Leukocytes, UA NEGATIVE NEGATIVE  Urine culture     Status: None   Collection Time: 07/20/14  6:16 PM  Result Value Ref Range   Specimen Description URINE, CATHETERIZED    Special Requests NONE    Colony Count NO GROWTH Performed at [facility]     Culture NO GROWTH Performed at [facility]     Report Status 07/21/2014 FINAL   Urine microscopic-add on     Status: None   Collection Time: 07/20/14  6:16 PM  Result Value Ref Range   Squamous Epithelial / LPF RARE RARE   WBC, UA 0-2 <3 WBC/hpf   RBC / HPF 0-2 <3 RBC/hpf   Bacteria, UA RARE RARE  I-Stat CG4 Lactic Acid, ED (not at [facility])     Status: None   Collection Time: 07/20/14  7:56 PM  Result Value Ref Range   Lactic Acid,  Venous 1.93 0.5 - 2.0 mmol/L  MRSA PCR Screening     Status: None   Collection Time: 07/21/14 12:59 AM  Result Value Ref Range   MRSA by PCR NEGATIVE NEGATIVE    Comment:        The GeneXpert MRSA Assay (FDA approved for NASAL  specimens only), is one component of a comprehensive MRSA colonization surveillance program. It is not intended to diagnose MRSA infection nor to guide or monitor treatment for MRSA infections.   Comprehensive metabolic panel     Status: Abnormal   Collection Time: 07/21/14  5:30 AM  Result Value Ref Range   Sodium 136 135 - 145 mmol/L   Potassium 4.0 3.5 - 5.1 mmol/L   Chloride 103 96 - 112 mmol/L   CO2 24 19 - 32 mmol/L   Glucose, Bld 113 (H) 70 - 99 mg/dL   BUN 59 (H) 6 - 23 mg/dL   Creatinine, Ser 2.77 (H) 0.50 - 1.35 mg/dL   Calcium 8.7 8.4 - 10.5 mg/dL   Total Protein 6.6 6.0 - 8.3 g/dL   Albumin 2.7 (L) 3.5 - 5.2 g/dL   AST 54 (H) 0 - 37 U/L   ALT 30 0 - 53 U/L   Alkaline Phosphatase 84 39 - 117 U/L   Total Bilirubin 0.9 0.3 - 1.2 mg/dL   GFR calc non Af Amer 19 (L) >90 mL/min   GFR calc Af Amer 22 (L) >90 mL/min    Comment: (NOTE) The eGFR has been calculated using the CKD EPI equation. This calculation has not been validated in all clinical situations. eGFR's persistently <90 mL/min signify possible Chronic Kidney Disease.    Anion gap 9 5 - 15  CBC     Status: Abnormal   Collection Time: 07/21/14  5:30 AM  Result Value Ref Range   WBC 28.7 (H) 4.0 - 10.5 K/uL   RBC 4.07 (L) 4.22 - 5.81 MIL/uL   Hemoglobin 12.6 (L) 13.0 - 17.0 g/dL   HCT 39.0 39.0 - 52.0 %   MCV 95.8 78.0 - 100.0 fL   MCH 31.0 26.0 - 34.0 pg   MCHC 32.3 30.0 - 36.0 g/dL   RDW 14.8 11.5 - 15.5 %   Platelets 181 150 - 400 K/uL  Procalcitonin - Baseline     Status: None   Collection Time: 07/21/14  5:30 AM  Result Value Ref Range   Procalcitonin 2.87 ng/mL    Comment:        Interpretation: PCT > 2 ng/mL: Systemic infection (sepsis) is likely, unless other causes are known. (NOTE)         ICU PCT Algorithm               Non ICU PCT Algorithm    ----------------------------     ------------------------------         PCT < 0.25 ng/mL                 PCT < 0.1 ng/mL     Stopping of  antibiotics            Stopping of antibiotics       strongly encouraged.               strongly encouraged.    ----------------------------     ------------------------------       PCT level decrease by               PCT < 0.25 ng/mL       >=  80% from peak PCT       OR PCT 0.25 - 0.5 ng/mL          Stopping of antibiotics                                             encouraged.     Stopping of antibiotics           encouraged.    ----------------------------     ------------------------------       PCT level decrease by              PCT >= 0.25 ng/mL       < 80% from peak PCT        AND PCT >= 0.5 ng/mL            Continuing antibiotics                                               encouraged.       Continuing antibiotics            encouraged.    ----------------------------     ------------------------------     PCT level increase compared          PCT > 0.5 ng/mL         with peak PCT AND          PCT >= 0.5 ng/mL             Escalation of antibiotics                                          strongly encouraged.      Escalation of antibiotics        strongly encouraged.   Procalcitonin     Status: None   Collection Time: 07/22/14  5:42 AM  Result Value Ref Range   Procalcitonin 2.31 ng/mL    Comment:        Interpretation: PCT > 2 ng/mL: Systemic infection (sepsis) is likely, unless other causes are known. (NOTE)         ICU PCT Algorithm               Non ICU PCT Algorithm    ----------------------------     ------------------------------         PCT < 0.25 ng/mL                 PCT < 0.1 ng/mL     Stopping of antibiotics            Stopping of antibiotics       strongly encouraged.               strongly encouraged.    ----------------------------     ------------------------------       PCT level decrease by               PCT < 0.25 ng/mL       >= 80% from peak PCT       OR PCT 0.25 - 0.5 ng/mL          Stopping of  antibiotics                                              encouraged.     Stopping of antibiotics           encouraged.    ----------------------------     ------------------------------       PCT level decrease by              PCT >= 0.25 ng/mL       < 80% from peak PCT        AND PCT >= 0.5 ng/mL            Continuing antibiotics                                               encouraged.       Continuing antibiotics            encouraged.    ----------------------------     ------------------------------     PCT level increase compared          PCT > 0.5 ng/mL         with peak PCT AND          PCT >= 0.5 ng/mL             Escalation of antibiotics                                          strongly encouraged.      Escalation of antibiotics        strongly encouraged.   CBC     Status: Abnormal   Collection Time: 07/22/14  5:42 AM  Result Value Ref Range   WBC 25.5 (H) 4.0 - 10.5 K/uL   RBC 4.08 (L) 4.22 - 5.81 MIL/uL   Hemoglobin 12.7 (L) 13.0 - 17.0 g/dL   HCT 38.8 (L) 39.0 - 52.0 %   MCV 95.1 78.0 - 100.0 fL   MCH 31.1 26.0 - 34.0 pg   MCHC 32.7 30.0 - 36.0 g/dL   RDW 15.0 11.5 - 15.5 %   Platelets 185 150 - 400 K/uL  Basic metabolic panel     Status: Abnormal   Collection Time: 07/22/14  5:42 AM  Result Value Ref Range   Sodium 138 135 - 145 mmol/L   Potassium 3.7 3.5 - 5.1 mmol/L   Chloride 106 96 - 112 mmol/L   CO2 24 19 - 32 mmol/L   Glucose, Bld 97 70 - 99 mg/dL   BUN 61 (H) 6 - 23 mg/dL   Creatinine, Ser 2.76 (H) 0.50 - 1.35 mg/dL   Calcium 8.9 8.4 - 10.5 mg/dL   GFR calc non Af Amer 19 (L) >90 mL/min   GFR calc Af Amer 22 (L) >90 mL/min    Comment: (NOTE) The eGFR has been calculated using the CKD EPI equation. This calculation has not been validated in all clinical situations. eGFR's persistently <90 mL/min signify possible Chronic Kidney Disease.    Anion gap 8 5 - 15    Imaging / Studies: Ct Abdomen Pelvis Wo Contrast  07/20/2014  CLINICAL DATA:  Sacral decubitus ulceration. Worsening generalized  weakness and incontinence. Initial encounter.  EXAM: CT ABDOMEN AND PELVIS WITHOUT CONTRAST  TECHNIQUE: Multidetector CT imaging of the abdomen and pelvis was performed following the standard protocol without IV contrast.  COMPARISON:  CT of the abdomen and pelvis performed 05/24/2002, and renal ultrasound performed 07/06/2007  FINDINGS: Honeycombing and scarring are noted at the visualized lung bases.  A linear 3.5 cm hypodensity within the inferior right hepatic lobe may reflect remote injury or a cyst, increased in size from 2003. Previously noted hepatic cysts appear to have resolved. The spleen is unremarkable. Stones are noted dependently within the gallbladder. The gallbladder is otherwise unremarkable. The pancreas and adrenal glands are unremarkable.  Mild bilateral hydronephrosis is noted. This is thought to reflect marked distention of the bladder, without evidence of a distal obstructing stone. Multiple large right renal cysts are seen, measuring up to 9.1 cm in size. Nonspecific perinephric stranding is noted bilaterally. No nonobstructing renal stones are identified.  No free fluid is identified. The small bowel is unremarkable in appearance. The stomach is within normal limits. No acute vascular abnormalities are seen. Scattered calcification is noted along the abdominal aorta and its branches.  The appendix is normal in caliber, without evidence for appendicitis. Scattered diverticulosis is noted along the proximal sigmoid colon, and a single diverticulum is noted at the proximal descending colon, without evidence of diverticulitis.  The bladder is significantly distended. The prostate is mildly enlarged, measuring 5.1 cm in transverse dimension, with scattered calcification. No inguinal lymphadenopathy is seen.  There is diffuse soft tissue inflammation about the anorectal canal, slightly more prominent on the right, with a likely associated ulceration along the overlying right-sided perineal soft  tissues. Minimal air is noted within the right-sided soft tissues, likely reflect an underlying sinus tract, without a defined abscess. There is mild wall thickening along the distal rectum.  No acute osseous abnormalities are identified. Multilevel vacuum phenomenon is noted at the lower lumbar spine.  IMPRESSION: 1. Diffuse soft tissue inflammation around the anorectal canal, slightly more prominent on the right, with a likely associated ulceration along the overlying right-sided perineal soft tissues. Minimal air within the right-sided soft tissues likely reflects an underlying sinus tract, without a defined abscess. No evidence of involvement of osseous structures at this time. Mild wall thickening noted along the distal rectum. 2. Mild bilateral hydronephrosis is thought to reflect marked distention of the bladder. No evidence of distal obstructing stone. Multiple large right renal cysts seen. 3. Linear 3.5 cm hypodensity within the right hepatic lobe may reflect remote injury or a cyst. 4. Cholelithiasis; gallbladder otherwise unremarkable in appearance. 5. Scattered diverticulosis along the proximal sigmoid colon, without evidence of diverticulitis. 6. Scattered calcification along the abdominal aorta and its branches.   Electronically Signed   By: Garald Balding M.D.   On: 07/20/2014 21:06   Ct Head Wo Contrast  07/20/2014   CLINICAL DATA:  Altered mental status.  Dementia  EXAM: CT HEAD WITHOUT CONTRAST  TECHNIQUE: Contiguous axial images were obtained from the base of the skull through the vertex without intravenous contrast.  COMPARISON:  May 12, 2014  FINDINGS: Moderate diffuse atrophy is stable. There is no intracranial mass, hemorrhage, extra-axial fluid collection, or midline shift. 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 There is evidence of a prior infarct at the superior  temporal -occipital junction on the right, stable. There is extensive small vessel disease throughout the centra semiovale bilaterally. There is small vessel disease throughout both external capsules. There is no new gray-white compartment lesion. No acute infarct apparent. Bony calvarium appears intact. The mastoid air cells are clear.  IMPRESSION: Atrophy with prior infarcts on the right as well as widespread small vessel disease. No intracranial mass, hemorrhage, or acute appearing infarct.   Electronically Signed   By: Lowella Grip M.D.   On: 07/20/2014 19:19   Dg Chest Port 1 View  07/20/2014   CLINICAL DATA:  Weakness and altered mental status  EXAM: PORTABLE CHEST - 1 VIEW  COMPARISON:  None.  FINDINGS: There is no edema or consolidation. There is fibrotic type change in the periphery of the lungs bilaterally. Heart is mildly enlarged with pulmonary vascularity within normal limits. There is atherosclerotic change in a. No adenopathy. No bone lesions.  IMPRESSION: Peripheral fibrosis in the lungs bilaterally. No frank edema or consolidation. Mild cardiomegaly.   Electronically Signed   By: Lowella Grip M.D.   On: 07/20/2014 17:45    Medications / Allergies:  Scheduled Meds: . aspirin  325 mg Oral Daily  . divalproex  125 mg Oral BID  . donepezil  10 mg Oral QHS  . feeding supplement (ENSURE COMPLETE)  237 mL Oral BID BM  . memantine  10 mg Oral BID  . multivitamin with minerals  1 tablet Oral Daily  . piperacillin-tazobactam (ZOSYN)  IV  2.25 g Intravenous 4 times per day  . sodium chloride  3 mL Intravenous Q12H  . [START ON 07/23/2014] vancomycin  1,000 mg Intravenous Q48H   Continuous Infusions: . sodium chloride 75 mL/hr at 07/22/14 0737   PRN Meds:.acetaminophen **OR** acetaminophen, metoprolol, ondansetron **OR** ondansetron (ZOFRAN) IV, RESOURCE THICKENUP CLEAR  Antibiotics: Anti-infectives    Start     Dose/Rate Route Frequency Ordered Stop   07/23/14 1800  vancomycin  (VANCOCIN) IVPB 1000 mg/200 mL premix     1,000 mg200 mL/hr over 60 Minutes Intravenous Every 48 hours 07/21/14 1247     07/21/14 2200  vancomycin (VANCOCIN) IVPB 750 mg/150 ml premix  Status:  Discontinued     750 mg150 mL/hr over 60 Minutes Intravenous Every 24 hours 07/21/14 0515 07/21/14 1247   07/21/14 1800  vancomycin (VANCOCIN) IVPB 750 mg/150 ml premix     750 mg150 mL/hr over 60 Minutes Intravenous  Once 07/21/14 1247 07/21/14 1822   07/21/14 1400  piperacillin-tazobactam (ZOSYN) IVPB 2.25 g     2.25 g100 mL/hr over 30 Minutes Intravenous 4 times per day 07/21/14 1247     07/21/14 0100  piperacillin-tazobactam (ZOSYN) IVPB 3.375 g  Status:  Discontinued     3.375 g12.5 mL/hr over 240 Minutes Intravenous 3 times per day 07/21/14 0056 07/21/14 1247   07/20/14 2030  vancomycin (VANCOCIN) IVPB 1000 mg/200 mL premix     1,000 mg200 mL/hr over 60 Minutes Intravenous  Once 07/20/14 2026 07/20/14 2203   07/20/14 1830  cefTRIAXone (ROCEPHIN) 1 g in dextrose 5 % 50 mL IVPB     1 g100 mL/hr over 30 Minutes Intravenous  Once 07/20/14 1823 07/20/14 1931        Assessment/Plan Necrotic/infected sacral decubitus ulcer S/p bedside I&D 2/4 He needs further debridement of devascularized tissue in the OR and to further open up the wound for adequate packing.  I will discuss with his son Mikki Santee who is out of town.  We may proceed  with the procedure over the weekend pending OR schedule. The wound is adequately draining and therefore no urgent surgical intervention is needed Will continue with BID wet to dry dressing changes Await cultures Continue with antibiotics   Erby Pian, ANP-BC [facility] Pager [phone])   07/22/2014 8:35 AM

## 2014-07-22 NOTE — Progress Notes (Signed)
Clinical Social Work Department CLINICAL SOCIAL WORK PLACEMENT NOTE 07/22/2014  Patient:  [name],Darryl Summers  Account Number:  [id number] Admit date:  07/20/2014  Clinical Social Worker:  Unk LightningHOLLY Annamae Shivley, LCSW  Date/time:  07/22/2014 10:00 AM  Clinical Social Work is seeking post-discharge placement for this patient at the following level of care:   SKILLED NURSING   (*CSW will update this form in Epic as items are completed)   07/22/2014  [facility]/family provided with [facility] of Clinical [facility] Work's list of facilities offering this level of care within the geographic area requested by the patient (or if unable, by the patient's family).  07/22/2014  Patient/family informed of their freedom to choose among providers that offer the needed level of care, that participate in Medicare, Medicaid or managed care program needed by the patient, have an available bed and are willing to accept the patient.  07/22/2014  Patient/family informed of [facility]' ownership interest in [facility], as well as of the fact that they are under no obligation to receive care at this facility.  PASARR submitted to EDS on 07/22/2014 PASARR number received on 07/22/2014  FL2 transmitted to all facilities in geographic area requested by pt/family on  07/22/2014 FL2 transmitted to all facilities within larger geographic area on   Patient informed that his/her managed care company has contracts with or will negotiate with  certain facilities, including the following:     Patient/family informed of bed offers received:   Patient chooses bed at  Physician recommends and patient chooses bed at    Patient to be transferred to  on   Patient to be transferred to facility by  Patient and family notified of transfer on  Name of family member notified:    The following physician request were entered in Epic:   Additional Comments:

## 2014-07-23 DIAGNOSIS — E876 Hypokalemia: Secondary | ICD-10-CM

## 2014-07-23 DIAGNOSIS — I10 Essential (primary) hypertension: Secondary | ICD-10-CM

## 2014-07-23 LAB — BASIC METABOLIC PANEL
Anion gap: 6 (ref 5–15)
BUN: 39 mg/dL — ABNORMAL HIGH (ref 6–23)
CO2: 27 mmol/L (ref 19–32)
Calcium: 8.5 mg/dL (ref 8.4–10.5)
Chloride: 105 mmol/L (ref 96–112)
Creatinine, Ser: 1.52 mg/dL — ABNORMAL HIGH (ref 0.50–1.35)
GFR calc non Af Amer: 39 mL/min — ABNORMAL LOW (ref >90)
GFR, EST AFRICAN AMERICAN: 46 mL/min — AB (ref >90)
GLUCOSE: 148 mg/dL — AB (ref 70–99)
Potassium: 2.9 mmol/L — ABNORMAL LOW (ref 3.5–5.1)
Sodium: 138 mmol/L (ref 135–145)

## 2014-07-23 LAB — CBC
HCT: 42.4 % (ref 39.0–52.0)
HEMOGLOBIN: 13.9 g/dL (ref 13.0–17.0)
MCH: 31 pg (ref 26.0–34.0)
MCHC: 32.8 g/dL (ref 30.0–36.0)
MCV: 94.4 fL (ref 78.0–100.0)
Platelets: 217 10*3/uL (ref 150–400)
RBC: 4.49 MIL/uL (ref 4.22–5.81)
RDW: 14.7 % (ref 11.5–15.5)
WBC: 18.6 10*3/uL — ABNORMAL HIGH (ref 4.0–10.5)

## 2014-07-23 MED ORDER — VANCOMYCIN HCL IN DEXTROSE 1-5 GM/200ML-% IV SOLN
1000.0000 mg | INTRAVENOUS | Status: DC
  Administered 2014-07-23 – 2014-07-24 (×3): 1000 mg via INTRAVENOUS
  Filled 2014-07-23 (×2): qty 200

## 2014-07-23 MED ORDER — PIPERACILLIN-TAZOBACTAM 3.375 G IVPB
3.3750 g | Freq: Three times a day (TID) | INTRAVENOUS | Status: DC
  Administered 2014-07-23 – 2014-07-24 (×3): 3.375 g via INTRAVENOUS
  Filled 2014-07-23 (×4): qty 50

## 2014-07-23 MED ORDER — POTASSIUM CHLORIDE 10 MEQ/100ML IV SOLN
10.0000 meq | INTRAVENOUS | Status: AC
  Administered 2014-07-23 (×4): 10 meq via INTRAVENOUS
  Filled 2014-07-23 (×4): qty 100

## 2014-07-23 NOTE — Progress Notes (Signed)
Subjective: No complaints.  Very pleasant.  Objective: Vital signs in last 24 hours: Temp:  [97.3 F (36.3 C)-98.3 F (36.8 C)] 97.8 F (36.6 C) (02/06 0900) Pulse Rate:  [78-91] 86 (02/06 0900) Resp:  [18] 18 (02/06 0900) BP: (115-174)/(62-77) 124/68 mmHg (02/06 0900) SpO2:  [96 %-100 %] 98 % (02/06 0900) Last BM Date: 07/20/14  Intake/Output from previous day: 02/05 0701 - 02/06 0700 In: 1189.8 [P.O.:371; I.V.:768.8; IV Piggyback:50] Out: 1000 [Urine:1000] Intake/Output this shift:    PE: General- In NAD Left arm-redness and induration around IV site (nurse informed) Sacrum-open wound with necrotic tissue present some of which was sharply debrided allowing for drainage of purulent fluid.  Lab Results:   Recent Labs  07/22/14 0542 07/23/14 0526  WBC 25.5* 18.6*  HGB 12.7* 13.9  HCT 38.8* 42.4  PLT 185 217   BMET  Recent Labs  07/22/14 0542 07/23/14 0526  NA 138 138  K 3.7 2.9*  CL 106 105  CO2 24 27  GLUCOSE 97 148*  BUN 61* 39*  CREATININE 2.76* 1.52*  CALCIUM 8.9 8.5   PT/INR No results for input(s): LABPROT, INR in the last 72 hours. Comprehensive Metabolic Panel:    Component Value Date/Time   NA 138 07/23/2014 0526   NA 138 07/22/2014 0542   K 2.9* 07/23/2014 0526   K 3.7 07/22/2014 0542   CL 105 07/23/2014 0526   CL 106 07/22/2014 0542   CO2 27 07/23/2014 0526   CO2 24 07/22/2014 0542   BUN 39* 07/23/2014 0526   BUN 61* 07/22/2014 0542   CREATININE 1.52* 07/23/2014 0526   CREATININE 2.76* 07/22/2014 0542   GLUCOSE 148* 07/23/2014 0526   GLUCOSE 97 07/22/2014 0542   CALCIUM 8.5 07/23/2014 0526   CALCIUM 8.9 07/22/2014 0542   AST 54* 07/21/2014 0530   AST 73* 07/20/2014 1713   ALT 30 07/21/2014 0530   ALT 32 07/20/2014 1713   ALKPHOS 84 07/21/2014 0530   ALKPHOS 101 07/20/2014 1713   BILITOT 0.9 07/21/2014 0530   BILITOT 1.1 07/20/2014 1713   PROT 6.6 07/21/2014 0530   PROT 8.0 07/20/2014 1713   ALBUMIN 2.7* 07/21/2014 0530    ALBUMIN 3.4* 07/20/2014 1713     Studies/Results: No results found.  Anti-infectives: Anti-infectives    Start     Dose/Rate Route Frequency Ordered Stop   07/23/14 1800  vancomycin (VANCOCIN) IVPB 1000 mg/200 mL premix  Status:  Discontinued     1,000 mg200 mL/hr over 60 Minutes Intravenous Every 48 hours 07/21/14 1247 07/23/14 0820   07/23/14 1100  piperacillin-tazobactam (ZOSYN) IVPB 3.375 g     3.375 g12.5 mL/hr over 240 Minutes Intravenous Every 8 hours 07/23/14 0817     07/23/14 0830  vancomycin (VANCOCIN) IVPB 1000 mg/200 mL premix     1,000 mg200 mL/hr over 60 Minutes Intravenous Every 24 hours 07/23/14 0820     07/21/14 2200  vancomycin (VANCOCIN) IVPB 750 mg/150 ml premix  Status:  Discontinued     750 mg150 mL/hr over 60 Minutes Intravenous Every 24 hours 07/21/14 0515 07/21/14 1247   07/21/14 1800  vancomycin (VANCOCIN) IVPB 750 mg/150 ml premix     750 mg150 mL/hr over 60 Minutes Intravenous  Once 07/21/14 1247 07/21/14 1822   07/21/14 1400  piperacillin-tazobactam (ZOSYN) IVPB 2.25 g  Status:  Discontinued     2.25 g100 mL/hr over 30 Minutes Intravenous 4 times per day 07/21/14 1247 07/23/14 0817   07/21/14 0100  piperacillin-tazobactam (ZOSYN) IVPB  3.375 g  Status:  Discontinued     3.375 g12.5 mL/hr over 240 Minutes Intravenous 3 times per day 07/21/14 0056 07/21/14 1247   07/20/14 2030  vancomycin (VANCOCIN) IVPB 1000 mg/200 mL premix     1,000 mg200 mL/hr over 60 Minutes Intravenous  Once 07/20/14 2026 07/20/14 2203   07/20/14 1830  cefTRIAXone (ROCEPHIN) 1 g in dextrose 5 % 50 mL IVPB     1 g100 mL/hr over 30 Minutes Intravenous  Once 07/20/14 1823 07/20/14 1931      Assessment   Infected decubitus ulcer-some debridement done today but will need further debridement in OR.  No longer septic       LOS: 3 days   Plan: Continue abxs and wound care. Wait for son to get in town to get consent.   Leomia Blake Shela Commons 07/23/2014

## 2014-07-23 NOTE — Progress Notes (Signed)
PROGRESS NOTE    Darryl Summers AVW:[id number] DOB: [birth year] DOA: 07/20/2014 PCP: No primary care provider on file.  HPI/Brief narrative 81 year old male patient, SNF resident, with history of moderate to advanced Alzheimer's disease, A. fib not on anticoagulation, HTN, stroke, presented to the ED with weakness and found to have sepsis secondary to infected sacral decubitus ulcers. Surgery consulted. Patient is status post I&D on 07/21/14 with plans for further debridement in or possibly on Monday.   Assessment/Plan:  1. Sepsis, present on admission: Secondary to infected sacral decubitus ulcers. Treated with IV fluid hydration and antibiotics. Sepsis physiology have improved. Blood cultures 2: Negative to date. Urine culture negative.  2. Necrotic/Infected sacral decubitus ulcer: A CT scan performed on 07/20/2014 showed diffuse soft tissue inflammation around the anorectal canal which could be consistent with acute infection of sacral decubitus ulcer. There is no evidence of abscess or involvement of bones per radiology report. General surgery was consulted as he underwent bedside debridement on 07/21/2014. Wound cultures drawn on 07/21/2014 showing abundant Staphylococcus aureus. Susceptibility testing pending. For now will continue IV vancomycin along with IV Zosyn until cultures are finalized. Blood cultures remain negative. Will likely undergo debridement in OR as recommended by general surgery. 3. Hypokalemia. A.m. labs showing a potassium of 2.9, will replace with IV potassium today. 4. Chronic atrial fibrillation: Not on rate control medications PTA. Not on anticoagulation despite high CHA2DS2- VASc score 5, presumably due to his dementia.  5. Acute on stage III chronic kidney disease: Likely secondary to sepsis, hypovolemia. Creatinine trending down from 2.76 on 07/22/2014 to 1.52 on a.m. lab work. Clinically improving, we'll discontinue IV fluids and encourage oral  intake. 6. Advanced Alzheimer's disease: Pleasantly confused but no behavioral disturbances. Continue Aricept and Namenda. Mental status at baseline. Continue divalproex. 7. Essential hypertension: Controlled without home medications. Monitor. 8. Incidental cholelithiasis: Noted on CT. Patient asymptomatic. 9. Leukocytosis: Secondary to problem #1 and 2. Gradually improving. Follow CBCs.  10. Mild bilateral hydronephrosis/urinary retention: Seen on CT. Possibly from urinary retention? Related to BPH. Continue Foley catheter for now to avoid soiling of necrotic/infected sacral wound.    Code Status: DNR. Confirmed with family by Myra Rude, Surgery ANP-BC.  Family Communication: None at bedside. Disposition Plan: Return to SNF memory care unit when medically stable.   Consultants:  General surgery  Procedures:  Bedside debridement of necrotic/infected sacral wound on 07/21/14  Foley catheter  Antibiotics:  IV vancomycin 07/20/14  IV Zosyn 07/20/14   Subjective: Patient pleasantly confused, has no complaints.  Objective: Filed Vitals:   07/22/14 2016 07/23/14 0001 07/23/14 0400 07/23/14 0900  BP: 138/77 174/71 122/72 124/68  Pulse: 91 79 87 86  Temp: 98.2 F (36.8 C) 98.1 F (36.7 C) 97.3 F (36.3 C) 97.8 F (36.6 C)  TempSrc: Oral Oral Oral Oral  Resp: Height:      Weight:      SpO2: 98% 96% 99% 98%    Intake/Output Summary (Last 24 hours) at 07/23/14 0948 Last data filed at 07/23/14 0445  Gross per 24 hour  Intake 1189.75 ml  Output   1000 ml  Net 189.75 ml   Filed Weights   07/20/14 1725  Weight: 79.379 kg (175 lb)     Exam:  General exam: Pleasant elderly male lying comfortably supine in bed.  Respiratory system: Clear. No increased work of breathing. Cardiovascular system: S1 & S2 heard, RRR. No JVD, murmurs, gallops, clicks or pedal edema.Telemetry: Atrial  fibrillation with controlled ventricular rate.  Gastrointestinal system: Abdomen  is nondistended, soft and nontender. Normal bowel sounds heard. Foley +  Central nervous system: Alert and oriented to self and partly to place. No focal neurological deficits. Follows instructions.  Extremities: Symmetric 5 x 5 power. Skin: Dressing over sacral decubitus, clean, dry and intact.   Data Reviewed: Basic Metabolic Panel:  Recent Labs Lab 07/20/14 1713 07/21/14 0530 07/22/14 0542 07/23/14 0526  NA 135 136 138 138  K 3.7 4.0 3.7 2.9*  CL 98 103 106 105  CO2 GLUCOSE 135* 113* 97 148*  BUN 67* 59* 61* 39*  CREATININE 2.63* 2.77* 2.76* 1.52*  CALCIUM 9.4 8.7 8.9 8.5   Liver Function Tests:  Recent Labs Lab 07/20/14 1713 07/21/14 0530  AST 73* 54*  ALT 32 30  ALKPHOS 101 84  BILITOT 1.1 0.9  PROT 8.0 6.6  ALBUMIN 3.4* 2.7*   No results for input(s): LIPASE, AMYLASE in the last 168 hours. No results for input(s): AMMONIA in the last 168 hours. CBC:  Recent Labs Lab 07/20/14 1713 07/21/14 0530 07/22/14 0542 07/23/14 0526  WBC 28.3* 28.7* 25.5* 18.6*  NEUTROABS 25.5*  --   --   --   HGB 13.7 12.6* 12.7* 13.9  HCT 42.2 39.0 38.8* 42.4  MCV 96.1 95.8 95.1 94.4  PLT 203 181 185 217   Cardiac Enzymes: No results for input(s): CKTOTAL, CKMB, CKMBINDEX, TROPONINI in the last 168 hours. BNP (last 3 results) No results for input(s): PROBNP in the last 8760 hours. CBG: No results for input(s): GLUCAP in the last 168 hours.  Recent Results (from the past 240 hour(s))  Blood Culture (routine x 2)     Status: None (Preliminary result)   Collection Time: 07/20/14  5:12 PM  Result Value Ref Range Status   Specimen Description BLOOD BLOOD LEFT FOREARM  Final   Special Requests BOTTLES DRAWN AEROBIC AND ANAEROBIC 5 CC EA  Final   Culture   Final           BLOOD CULTURE RECEIVED NO GROWTH TO DATE CULTURE WILL BE HELD FOR 5 DAYS BEFORE ISSUING A FINAL NEGATIVE REPORT Performed at [facility]    Report Status PENDING  Incomplete  Blood  Culture (routine x 2)     Status: None (Preliminary result)   Collection Time: 07/20/14  5:20 PM  Result Value Ref Range Status   Specimen Description BLOOD LEFT ANTECUBITAL  Final   Special Requests BOTTLES DRAWN AEROBIC AND ANAEROBIC 5CC EACH  Final   Culture   Final           BLOOD CULTURE RECEIVED NO GROWTH TO DATE CULTURE WILL BE HELD FOR 5 DAYS BEFORE ISSUING A FINAL NEGATIVE REPORT Performed at [facility]    Report Status PENDING  Incomplete  Urine culture     Status: None   Collection Time: 07/20/14  6:16 PM  Result Value Ref Range Status   Specimen Description URINE, CATHETERIZED  Final   Special Requests NONE  Final   Colony Count NO GROWTH Performed at [facility]   Final   Culture NO GROWTH Performed at [facility]   Final   Report Status 07/21/2014 FINAL  Final  MRSA PCR Screening     Status: None   Collection Time: 07/21/14 12:59 AM  Result Value Ref Range Status   MRSA by PCR NEGATIVE NEGATIVE Final    Comment:  The GeneXpert MRSA Assay (FDA approved for NASAL specimens only), is one component of a comprehensive MRSA colonization surveillance program. It is not intended to diagnose MRSA infection nor to guide or monitor treatment for MRSA infections.   Wound culture     Status: None (Preliminary result)   Collection Time: 07/21/14  1:17 PM  Result Value Ref Range Status   Specimen Description SACRAL  Final   Special Requests Normal  Final   Gram Stain   Final    FEW WBC PRESENT,BOTH PMN AND MONONUCLEAR NO SQUAMOUS EPITHELIAL CELLS SEEN MODERATE GRAM POSITIVE COCCI IN PAIRS IN CLUSTERS Performed at [facility]    Culture   Final    ABUNDANT STAPHYLOCOCCUS AUREUS Note: RIFAMPIN AND GENTAMICIN SHOULD NOT BE USED AS SINGLE DRUGS FOR TREATMENT OF STAPH INFECTIONS. Performed at [facility]    Report Status PENDING  Incomplete           Studies: No results found.      Scheduled Meds: .  aspirin  325 mg Oral Daily  . divalproex  125 mg Oral BID  . donepezil  10 mg Oral QHS  . feeding supplement (ENSURE COMPLETE)  237 mL Oral BID BM  . memantine  10 mg Oral BID  . multivitamin with minerals  1 tablet Oral Daily  . piperacillin-tazobactam (ZOSYN)  IV  3.375 g Intravenous Q8H  . sodium chloride  3 mL Intravenous Q12H  . vancomycin  1,000 mg Intravenous Q24H   Continuous Infusions:    Principal Problem:   Sepsis due to cellulitis Active Problems:   Sacral decubitus ulcer   Infected decubitus ulcer   Chronic a-fib   Dementia   Essential hypertension    Time spent: 35 minutes.   Jeralyn BennettZAMORA, Amiliana Foutz, MD, FACP, FHM. [facility] Pager [phone]  If 7PM-7AM, please contact night-coverage [email or web address] Password [email or web address] 07/23/2014, 9:48 AM    LOS: 3 days

## 2014-07-23 NOTE — Clinical Social Work Placement (Signed)
Clinical Social Work Department CLINICAL SOCIAL WORK PLACEMENT NOTE 07/23/2014  Patient:  Darryl Summers,Darryl Summers  Account Number:  [id number] Admit date:  07/20/2014  Clinical Social Worker:  Unk LightningHOLLY GERBER, LCSW  Date/time:  07/22/2014 10:00 AM  Clinical Social Work is seeking post-discharge placement for this patient at the following level of care:   SKILLED NURSING   (*CSW will update this form in Epic as items are completed)   07/22/2014  [facility]/family provided with [facility] of Clinical Social Work's list of facilities offering this level of care within the geographic area requested by the patient (or if unable, by the patient's family).  07/22/2014  Patient/family informed of their freedom to choose among providers that offer the needed level of care, that participate in Medicare, Medicaid or managed care program needed by the patient, have an available bed and are willing to accept the patient.  07/22/2014  Patient/family informed of [facility]' ownership interest in [facility], as well as of the fact that they are under no obligation to receive care at this facility.  PASARR submitted to EDS on 07/22/2014 PASARR number received on 07/22/2014  FL2 transmitted to all facilities in geographic area requested by pt/family on  07/22/2014 FL2 transmitted to all facilities within larger geographic area on   Patient informed that his/her managed care company has contracts with or will negotiate with  certain facilities, including the following:     Patient/family informed of bed offers received:  07/23/2014 [facility] [place] Physician recommends and patient chooses bed at    Patient to be transferred to  on   Patient to be transferred to facility by  Patient and family notified of transfer on  Name of family member notified:    The following physician request were entered in Epic:   Additional Comments:  .Elray Bubaegina  Christmas Faraci, LCSW [facility] Clinical Social Worker - Weekend Coverage cell #: [phone]

## 2014-07-23 NOTE — Progress Notes (Signed)
ANTIBIOTIC CONSULT NOTE - FOLLOW UP  Pharmacy Consult for Vancomycin, Zosyn Indication: wound infection  No Known Allergies  Patient Measurements: Height: 5\' 10"  (177.8 cm) Weight: 175 lb (79.379 kg) IBW/kg (Calculated) : 73  Vital Signs: Temp: 97.3 F (36.3 C) (02/06 0400) Temp Source: Oral (02/06 0400) BP: 122/72 mmHg (02/06 0400) Pulse Rate: 87 (02/06 0400) Intake/Output from previous day: 02/05 0701 - 02/06 0700 In: 1189.8 [P.O.:371; I.V.:768.8; IV Piggyback:50] Out: 1000 [Urine:1000] Intake/Output from this shift:    Labs:  Recent Labs  07/21/14 0530 07/22/14 0542 07/23/14 0526  WBC 28.7* 25.5* 18.6*  HGB 12.6* 12.7* 13.9  PLT 181 185 217  CREATININE 2.77* 2.76* 1.52*   Estimated Creatinine Clearance: 35.4 mL/min (by C-G formula based on Cr of 1.52). No results for input(s): VANCOTROUGH, VANCOPEAK, VANCORANDOM, GENTTROUGH, GENTPEAK, GENTRANDOM, TOBRATROUGH, TOBRAPEAK, TOBRARND, AMIKACINPEAK, AMIKACINTROU, AMIKACIN in the last 72 hours.    Assessment: 81 yo male with hx of dementia who presents from his nursing facility due to worsening generalized weakness and inability to ambulate.Pt has a sacral stage 2 pressure ulcer that is oozing serosanguinous fluid, necrotic, and infected.  S/p bedside I&D on 2/4 with sacral wound culture growing abundant staph aureus.  Pharmacy is consulted to dose vancomycin and Zosyn.  2/3 >> zosyn >> 2/3 >> Vanc >>  Today, 07/23/2014:   Day # 4 Vancomycin and Zosyn  Tmax: improved to afebrile  WBC: elevated but improved, 18.6  Renal: SCr significantly improved to 1.52, CrCl 35 ml/min   Goal of Therapy:  Vancomycin trough level 15-20 mcg/ml  Plan:   Increase to Zosyn 3.375g IV Q8H infused over 4hrs.  Increase to Vancomycin 1g IV q24h.  Measure Vanc trough at steady state.  Follow up renal fxn and culture results.  Lynann Beaverhristine Tacia Hindley PharmD, BCPS Pager [phone] 07/23/2014 8:41 AM

## 2014-07-24 DIAGNOSIS — L89159 Pressure ulcer of sacral region, unspecified stage: Secondary | ICD-10-CM

## 2014-07-24 LAB — WOUND CULTURE: Special Requests: NORMAL

## 2014-07-24 LAB — BASIC METABOLIC PANEL
Anion gap: 9 (ref 5–15)
BUN: 27 mg/dL — ABNORMAL HIGH (ref 6–23)
CHLORIDE: 104 mmol/L (ref 96–112)
CO2: 26 mmol/L (ref 19–32)
Calcium: 8.7 mg/dL (ref 8.4–10.5)
Creatinine, Ser: 1.28 mg/dL (ref 0.50–1.35)
GFR, EST AFRICAN AMERICAN: 56 mL/min — AB (ref >90)
GFR, EST NON AFRICAN AMERICAN: 49 mL/min — AB (ref >90)
GLUCOSE: 100 mg/dL — AB (ref 70–99)
Potassium: 3.5 mmol/L (ref 3.5–5.1)
SODIUM: 139 mmol/L (ref 135–145)

## 2014-07-24 LAB — PROCALCITONIN: Procalcitonin: 0.49 ng/mL

## 2014-07-24 MED ORDER — OXYCODONE HCL 5 MG PO TABS
5.0000 mg | ORAL_TABLET | Freq: Four times a day (QID) | ORAL | Status: DC | PRN
  Administered 2014-07-27 – 2014-07-28 (×3): 5 mg via ORAL
  Filled 2014-07-24 (×3): qty 1

## 2014-07-24 MED ORDER — ASPIRIN 325 MG PO TABS
325.0000 mg | ORAL_TABLET | Freq: Every day | ORAL | Status: DC
  Administered 2014-07-24 – 2014-07-28 (×4): 325 mg via ORAL
  Filled 2014-07-24 (×5): qty 1

## 2014-07-24 MED ORDER — VANCOMYCIN HCL IN DEXTROSE 750-5 MG/150ML-% IV SOLN
750.0000 mg | Freq: Two times a day (BID) | INTRAVENOUS | Status: DC
  Administered 2014-07-24 – 2014-07-26 (×5): 750 mg via INTRAVENOUS
  Filled 2014-07-24 (×6): qty 150

## 2014-07-24 MED ORDER — MORPHINE SULFATE 2 MG/ML IJ SOLN
1.0000 mg | INTRAMUSCULAR | Status: DC | PRN
  Administered 2014-07-26 – 2014-07-27 (×2): 1 mg via INTRAVENOUS
  Filled 2014-07-24 (×2): qty 1

## 2014-07-24 MED ORDER — LORAZEPAM 0.5 MG PO TABS
0.5000 mg | ORAL_TABLET | Freq: Once | ORAL | Status: AC
  Administered 2014-07-24: 0.5 mg via ORAL
  Filled 2014-07-24: qty 1

## 2014-07-24 NOTE — Progress Notes (Signed)
ANTIBIOTIC CONSULT NOTE - FOLLOW UP  Pharmacy Consult for Vancomycin Indication: MRSA wound infection  No Known Allergies  Patient Measurements: Height: 5\' 10"  (177.8 cm) Weight: 175 lb (79.379 kg) IBW/kg (Calculated) : 73  Vital Signs: Temp: 97.8 F (36.6 C) (02/07 0400) Temp Source: Oral (02/07 0400) BP: 141/85 mmHg (02/07 0400) Pulse Rate: 74 (02/07 0400) Intake/Output from previous day: 02/06 0701 - 02/07 0700 In: 890 [P.O.:240; IV Piggyback:650] Out: 2100 [Urine:2100] Intake/Output from this shift: Total I/O In: 30 [P.O.:30] Out: 450 [Urine:450]  Labs:  Recent Labs  07/22/14 0542 07/23/14 0526 07/24/14 0557  WBC 25.5* 18.6*  --   HGB 12.7* 13.9  --   PLT 185 217  --   CREATININE 2.76* 1.52* 1.28   Estimated Creatinine Clearance: 42 mL/min (by C-G formula based on Cr of 1.28). No results for input(s): VANCOTROUGH, VANCOPEAK, VANCORANDOM, GENTTROUGH, GENTPEAK, GENTRANDOM, TOBRATROUGH, TOBRAPEAK, TOBRARND, AMIKACINPEAK, AMIKACINTROU, AMIKACIN in the last 72 hours.    Assessment: 81 yo male with hx of dementia who presents from his nursing facility due to worsening generalized weakness and inability to ambulate.Pt has a sacral stage 2 pressure ulcer that is oozing serosanguinous fluid, necrotic, and infected.  S/p bedside I&D on 2/4 with sacral wound culture growing abundant staph aureus.  Pharmacy is consulted to dose vancomycin and Zosyn.  2/3 >> zosyn >> 2/7 2/3 >> Vanc >>  Today, 07/24/2014:   Day # 5 Vancomycin  Tmax: afebrile  WBC: elevated but improved, 18.6 (2/6)  Renal: SCr significantly improved to 1.28, CrCl 42 ml/min   Goal of Therapy:  Vancomycin trough level 15-20 mcg/ml  Plan:   Increase to Vancomycin 750mg  IV q12h.  Measure Vanc trough at steady state.  Follow up renal fxn and culture results.  Lynann Beaverhristine Seleste Tallman PharmD, BCPS Pager [phone] 07/24/2014 10:52 AM

## 2014-07-24 NOTE — Progress Notes (Signed)
PROGRESS NOTE    Darryl Summers WUJ:[id number] DOB: 1924/12/01 DOA: 07/20/2014 PCP: No primary care provider on file.  HPI/Brief narrative 81 year old male patient, SNF resident, with history of moderate to advanced Alzheimer's disease, A. fib not on anticoagulation, HTN, stroke, presented to the ED with weakness and found to have sepsis secondary to infected sacral decubitus ulcers. Surgery consulted. Patient is status post I&D on 07/21/14 with plans for further debridement in or possibly on Monday.   Assessment/Plan:  1. Sepsis, present on admission: Secondary to infected sacral decubitus ulcers. Treated with IV fluid hydration and antibiotics. Sepsis physiology have improved. Blood cultures 2: Negative to date. Urine culture negative.  2. Necrotic/Infected sacral decubitus ulcer: A CT scan performed on 07/20/2014 showed diffuse soft tissue inflammation around the anorectal canal which could be consistent with acute infection of sacral decubitus ulcer. There is no evidence of abscess or involvement of bones per radiology report. General surgery was consulted as he underwent bedside debridement on 07/21/2014. Wound cultures drawn on 07/21/2014 showing abundant Staphylococcus aureus with susceptibility testing showing MRSA. Blood cultures remain negative. Will likely undergo debridement in OR as recommended by general surgery. Will narrow antimicrobial spectrum, stopping Zosyn and continue IV Vancomycin.  3. Hypokalemia. A.m. labs showing showing a potassium level of 3.5 from 2.9 after replacement 4. Chronic atrial fibrillation: Not on rate control medications PTA. Not on anticoagulation despite high CHA2DS2- VASc score 5, presumably due to his dementia.  5. Acute on stage III chronic kidney disease: Likely secondary to sepsis, hypovolemia. Creatinine trending down to 1.28 today from 2.76 (07/22/2014) with volume resuscitation.  6. Advanced Alzheimer's disease: Pleasantly confused but no  behavioral disturbances. Continue Aricept and Namenda. Mental status at baseline. Continue divalproex. 7. Essential hypertension: Controlled without home medications. Monitor. 8. Incidental cholelithiasis: Noted on CT. Patient asymptomatic. 9. Leukocytosis: Secondary to problem #1 and 2. Gradually improving. Follow CBCs.  10. Mild bilateral hydronephrosis/urinary retention: Seen on CT. Possibly from urinary retention? Related to BPH. Continue Foley catheter for now to avoid soiling of necrotic/infected sacral wound.    Code Status: DNR. Confirmed with family by Myra Rude, Surgery ANP-BC.  Family Communication: None at bedside. Disposition Plan: Return to SNF memory care unit when medically stable.   Consultants:  General surgery  Procedures:  Bedside debridement of necrotic/infected sacral wound on 07/21/14  Foley catheter  Antibiotics:  IV vancomycin 07/20/14 stopped on 07/24/2014  IV Zosyn 07/20/14   Subjective: Patient pleasantly confused, has no complaints.  Objective: Filed Vitals:   07/23/14 1828 07/23/14 2005 07/24/14 0010 07/24/14 0400  BP: 142/68 143/66 139/79 141/85  Pulse: 71 74 75 74  Temp: 97.6 F (36.4 C) 98.4 F (36.9 C) 97.6 F (36.4 C) 97.8 F (36.6 C)  TempSrc: Oral Oral Oral Oral  Resp: Height:      Weight:      SpO2: 99% 100% 96% 98%    Intake/Output Summary (Last 24 hours) at 07/24/14 1020 Last data filed at 07/24/14 9562  Gross per 24 hour  Intake    480 ml  Output   2200 ml  Net  -1720 ml   Filed Weights   07/20/14 1725  Weight: 79.379 kg (175 lb)     Exam:  General exam: Pleasant elderly male lying comfortably supine in bed.  Respiratory system: Clear. No increased work of breathing. Cardiovascular system: S1 & S2 heard, RRR. No JVD, murmurs, gallops, clicks or pedal edema.Telemetry: Atrial fibrillation with controlled ventricular  rate.  Gastrointestinal system: Abdomen is nondistended, soft and nontender. Normal bowel  sounds heard. Foley +  Central nervous system: Alert and oriented to self and partly to place. No focal neurological deficits. Follows instructions.  Extremities: Symmetric 5 x 5 power. Skin: Dressing over sacral decubitus, clean, dry and intact.   Data Reviewed: Basic Metabolic Panel:  Recent Labs Lab 07/20/14 1713 07/21/14 0530 07/22/14 0542 07/23/14 0526 07/24/14 0557  NA 135 136 138 138 139  K 3.7 4.0 3.7 2.9* 3.5  CL 98 103 106 105 104  CO2 GLUCOSE 135* 113* 97 148* 100*  BUN 67* 59* 61* 39* 27*  CREATININE 2.63* 2.77* 2.76* 1.52* 1.28  CALCIUM 9.4 8.7 8.9 8.5 8.7   Liver Function Tests:  Recent Labs Lab 07/20/14 1713 07/21/14 0530  AST 73* 54*  ALT 32 30  ALKPHOS 101 84  BILITOT 1.1 0.9  PROT 8.0 6.6  ALBUMIN 3.4* 2.7*   No results for input(s): LIPASE, AMYLASE in the last 168 hours. No results for input(s): AMMONIA in the last 168 hours. CBC:  Recent Labs Lab 07/20/14 1713 07/21/14 0530 07/22/14 0542 07/23/14 0526  WBC 28.3* 28.7* 25.5* 18.6*  NEUTROABS 25.5*  --   --   --   HGB 13.7 12.6* 12.7* 13.9  HCT 42.2 39.0 38.8* 42.4  MCV 96.1 95.8 95.1 94.4  PLT 203 181 185 217   Cardiac Enzymes: No results for input(s): CKTOTAL, CKMB, CKMBINDEX, TROPONINI in the last 168 hours. BNP (last 3 results) No results for input(s): PROBNP in the last 8760 hours. CBG: No results for input(s): GLUCAP in the last 168 hours.  Recent Results (from the past 240 hour(s))  Blood Culture (routine x 2)     Status: None (Preliminary result)   Collection Time: 07/20/14  5:12 PM  Result Value Ref Range Status   Specimen Description BLOOD BLOOD LEFT FOREARM  Final   Special Requests BOTTLES DRAWN AEROBIC AND ANAEROBIC 5 CC EA  Final   Culture   Final           BLOOD CULTURE RECEIVED NO GROWTH TO DATE CULTURE WILL BE HELD FOR 5 DAYS BEFORE ISSUING A FINAL NEGATIVE REPORT Performed at [facility]    Report Status PENDING  Incomplete  Blood  Culture (routine x 2)     Status: None (Preliminary result)   Collection Time: 07/20/14  5:20 PM  Result Value Ref Range Status   Specimen Description BLOOD LEFT ANTECUBITAL  Final   Special Requests BOTTLES DRAWN AEROBIC AND ANAEROBIC 5CC EACH  Final   Culture   Final           BLOOD CULTURE RECEIVED NO GROWTH TO DATE CULTURE WILL BE HELD FOR 5 DAYS BEFORE ISSUING A FINAL NEGATIVE REPORT Performed at [facility]    Report Status PENDING  Incomplete  Urine culture     Status: None   Collection Time: 07/20/14  6:16 PM  Result Value Ref Range Status   Specimen Description URINE, CATHETERIZED  Final   Special Requests NONE  Final   Colony Count NO GROWTH Performed at [facility]   Final   Culture NO GROWTH Performed at [facility]   Final   Report Status 07/21/2014 FINAL  Final  MRSA PCR Screening     Status: None   Collection Time: 07/21/14 12:59 AM  Result Value Ref Range Status   MRSA by PCR NEGATIVE NEGATIVE Final  Comment:        The GeneXpert MRSA Assay (FDA approved for NASAL specimens only), is one component of a comprehensive MRSA colonization surveillance program. It is not intended to diagnose MRSA infection nor to guide or monitor treatment for MRSA infections.   Wound culture     Status: None   Collection Time: 07/21/14  1:17 PM  Result Value Ref Range Status   Specimen Description SACRAL  Final   Special Requests Normal  Final   Gram Stain   Final    FEW WBC PRESENT,BOTH PMN AND MONONUCLEAR NO SQUAMOUS EPITHELIAL CELLS SEEN MODERATE GRAM POSITIVE COCCI IN PAIRS IN CLUSTERS Performed at [facility]    Culture   Final    ABUNDANT METHICILLIN RESISTANT STAPHYLOCOCCUS AUREUS Note: RIFAMPIN AND GENTAMICIN SHOULD NOT BE USED AS SINGLE DRUGS FOR TREATMENT OF STAPH INFECTIONS. This organism DOES NOT demonstrate inducible Clindamycin resistance in vitro. CRITICAL RESULT CALLED TO, READ BACK BY AND VERIFIED WITH: KIM C 2/7 @830   BY  REAMM Performed at [facility]    Report Status 07/24/2014 FINAL  Final   Organism ID, Bacteria METHICILLIN RESISTANT STAPHYLOCOCCUS AUREUS  Final      Susceptibility   Methicillin resistant staphylococcus aureus - MIC*    CLINDAMYCIN <=0.25 SENSITIVE Sensitive     ERYTHROMYCIN >=8 RESISTANT Resistant     GENTAMICIN <=0.5 SENSITIVE Sensitive     LEVOFLOXACIN >=8 RESISTANT Resistant     OXACILLIN >=4 RESISTANT Resistant     PENICILLIN >=0.5 RESISTANT Resistant     RIFAMPIN <=0.5 SENSITIVE Sensitive     TRIMETH/SULFA <=10 SENSITIVE Sensitive     VANCOMYCIN <=0.5 SENSITIVE Sensitive     TETRACYCLINE <=1 SENSITIVE Sensitive     * ABUNDANT METHICILLIN RESISTANT STAPHYLOCOCCUS AUREUS           Studies: No results found.      Scheduled Meds: . aspirin  325 mg Oral Daily  . divalproex  125 mg Oral BID  . donepezil  10 mg Oral QHS  . feeding supplement (ENSURE COMPLETE)  237 mL Oral BID BM  . memantine  10 mg Oral BID  . multivitamin with minerals  1 tablet Oral Daily  . sodium chloride  3 mL Intravenous Q12H  . vancomycin  1,000 mg Intravenous Q24H   Continuous Infusions:    Principal Problem:   Sepsis due to cellulitis Active Problems:   Sacral decubitus ulcer   Infected decubitus ulcer   Chronic a-fib   Dementia   Essential hypertension    Time spent: 35 minutes.   Jeralyn BennettZAMORA, Blaize Nipper, MD, FACP, FHM. [facility] Pager [phone]  If 7PM-7AM, please contact night-coverage [email or web address] Password [email or web address] 07/24/2014, 10:20 AM    LOS: 4 days

## 2014-07-24 NOTE — Progress Notes (Signed)
Subjective: Awake and alert.  Objective: Vital signs in last 24 hours: Temp:  [97.6 F (36.4 C)-98.4 F (36.9 C)] 97.8 F (36.6 C) (02/07 0400) Pulse Rate:  [71-75] 74 (02/07 0400) Resp:  [18-20] 20 (02/07 0400) BP: (139-143)/(66-85) 141/85 mmHg (02/07 0400) SpO2:  [96 %-100 %] 98 % (02/07 0400) Last BM Date: 07/22/14  Intake/Output from previous day: 02/06 0701 - 02/07 0700 In: 890 [P.O.:240; IV Piggyback:650] Out: 2100 [Urine:2100] Intake/Output this shift: Total I/O In: -  Out: 450 [Urine:450]  PE: General- In NAD Sacrum-open wound with necrotic tissue present debrided and drainage of purulent fluid; will not tolerated bedside debridement  Lab Results:   Recent Labs  07/22/14 0542 07/23/14 0526  WBC 25.5* 18.6*  HGB 12.7* 13.9  HCT 38.8* 42.4  PLT 185 217   BMET  Recent Labs  07/23/14 0526 07/24/14 0557  NA 138 139  K 2.9* 3.5  CL 105 104  CO2 27 26  GLUCOSE 148* 100*  BUN 39* 27*  CREATININE 1.52* 1.28  CALCIUM 8.5 8.7   PT/INR No results for input(s): LABPROT, INR in the last 72 hours. Comprehensive Metabolic Panel:    Component Value Date/Time   NA 139 07/24/2014 0557   NA 138 07/23/2014 0526   K 3.5 07/24/2014 0557   K 2.9* 07/23/2014 0526   CL 104 07/24/2014 0557   CL 105 07/23/2014 0526   CO2 26 07/24/2014 0557   CO2 27 07/23/2014 0526   BUN 27* 07/24/2014 0557   BUN 39* 07/23/2014 0526   CREATININE 1.28 07/24/2014 0557   CREATININE 1.52* 07/23/2014 0526   GLUCOSE 100* 07/24/2014 0557   GLUCOSE 148* 07/23/2014 0526   CALCIUM 8.7 07/24/2014 0557   CALCIUM 8.5 07/23/2014 0526   AST 54* 07/21/2014 0530   AST 73* 07/20/2014 1713   ALT 30 07/21/2014 0530   ALT 32 07/20/2014 1713   ALKPHOS 84 07/21/2014 0530   ALKPHOS 101 07/20/2014 1713   BILITOT 0.9 07/21/2014 0530   BILITOT 1.1 07/20/2014 1713   PROT 6.6 07/21/2014 0530   PROT 8.0 07/20/2014 1713   ALBUMIN 2.7* 07/21/2014 0530   ALBUMIN 3.4* 07/20/2014 1713      Studies/Results: No results found.  Anti-infectives: Anti-infectives    Start     Dose/Rate Route Frequency Ordered Stop   07/23/14 1800  vancomycin (VANCOCIN) IVPB 1000 mg/200 mL premix  Status:  Discontinued     1,000 mg200 mL/hr over 60 Minutes Intravenous Every 48 hours 07/21/14 1247 07/23/14 0820   07/23/14 1100  piperacillin-tazobactam (ZOSYN) IVPB 3.375 g     3.375 g12.5 mL/hr over 240 Minutes Intravenous Every 8 hours 07/23/14 0817     07/23/14 0830  vancomycin (VANCOCIN) IVPB 1000 mg/200 mL premix     1,000 mg200 mL/hr over 60 Minutes Intravenous Every 24 hours 07/23/14 0820     07/21/14 2200  vancomycin (VANCOCIN) IVPB 750 mg/150 ml premix  Status:  Discontinued     750 mg150 mL/hr over 60 Minutes Intravenous Every 24 hours 07/21/14 0515 07/21/14 1247   07/21/14 1800  vancomycin (VANCOCIN) IVPB 750 mg/150 ml premix     750 mg150 mL/hr over 60 Minutes Intravenous  Once 07/21/14 1247 07/21/14 1822   07/21/14 1400  piperacillin-tazobactam (ZOSYN) IVPB 2.25 g  Status:  Discontinued     2.25 g100 mL/hr over 30 Minutes Intravenous 4 times per day 07/21/14 1247 07/23/14 0817   07/21/14 0100  piperacillin-tazobactam (ZOSYN) IVPB 3.375 g  Status:  Discontinued  3.375 g12.5 mL/hr over 240 Minutes Intravenous 3 times per day 07/21/14 0056 07/21/14 1247   07/20/14 2030  vancomycin (VANCOCIN) IVPB 1000 mg/200 mL premix     1,000 mg200 mL/hr over 60 Minutes Intravenous  Once 07/20/14 2026 07/20/14 2203   07/20/14 1830  cefTRIAXone (ROCEPHIN) 1 g in dextrose 5 % 50 mL IVPB     1 g100 mL/hr over 30 Minutes Intravenous  Once 07/20/14 1823 07/20/14 1931      Assessment   Infected necrotic decubitus ulcer-will not tolerate bedside debridement.  Spoke with his son by phone this AM       LOS: 4 days   Plan:  To OR tomorrow for debridement.  Procedure and risks (including but not limited to bleeding, anesthesia, wound healing problems, need for more operations) explained to his son.   He agrees with the plan.   Alizee Maple Shela Commons 07/24/2014

## 2014-07-24 NOTE — Progress Notes (Signed)
Called to room by nurse tech. Patient laying in bed pulled  out IV's and pulling at foley cath appears very agitated upon assessment. Attempted to calm patient via redirection and therapeutic communication.  Patient is upset states he wants to leave and he can't stay here. Per night nurse Orlean Bradford( Vera Jackson, RN )patient has not slept all night and has been calling out rambling off and on. Bed alarm on and safety mitts applied to patient. Will continue to monitor and notify MD as needed.

## 2014-07-25 LAB — CBC
HEMATOCRIT: 41.9 % (ref 39.0–52.0)
Hemoglobin: 13.5 g/dL (ref 13.0–17.0)
MCH: 30.5 pg (ref 26.0–34.0)
MCHC: 32.2 g/dL (ref 30.0–36.0)
MCV: 94.8 fL (ref 78.0–100.0)
PLATELETS: 216 10*3/uL (ref 150–400)
RBC: 4.42 MIL/uL (ref 4.22–5.81)
RDW: 14.6 % (ref 11.5–15.5)
WBC: 16.3 10*3/uL — ABNORMAL HIGH (ref 4.0–10.5)

## 2014-07-25 LAB — BASIC METABOLIC PANEL
ANION GAP: 8 (ref 5–15)
BUN: 25 mg/dL — ABNORMAL HIGH (ref 6–23)
CALCIUM: 8.5 mg/dL (ref 8.4–10.5)
CHLORIDE: 102 mmol/L (ref 96–112)
CO2: 27 mmol/L (ref 19–32)
CREATININE: 1.21 mg/dL (ref 0.50–1.35)
GFR calc Af Amer: 60 mL/min — ABNORMAL LOW (ref >90)
GFR, EST NON AFRICAN AMERICAN: 52 mL/min — AB (ref >90)
Glucose, Bld: 89 mg/dL (ref 70–99)
Potassium: 3.6 mmol/L (ref 3.5–5.1)
SODIUM: 137 mmol/L (ref 135–145)

## 2014-07-25 MED ORDER — PRO-STAT SUGAR FREE PO LIQD
30.0000 mL | Freq: Three times a day (TID) | ORAL | Status: DC
  Administered 2014-07-27 – 2014-07-28 (×4): 30 mL via ORAL
  Filled 2014-07-25 (×12): qty 30

## 2014-07-25 NOTE — Progress Notes (Signed)
  Subjective: He looks fine.  I told him we are going to have to do his surgery tomorrow.  He is agreeable to that.  Ask what we were planning to do.  His pastor and friend of many years is with him now.  He is going to tell his son.  Objective: Vital signs in last 24 hours: Temp:  [97.5 F (36.4 C)-98.6 F (37 C)] 98.1 F (36.7 C) (02/08 0445) Pulse Rate:  [63-78] 78 (02/08 0445) Resp:  [18] 18 (02/08 0445) BP: (137-157)/(69-91) 145/87 mmHg (02/08 0445) SpO2:  [98 %-100 %] 99 % (02/08 0445) Last BM Date: 07/22/14 PO 130 recorded yesterday Afebrile, VSS Creatinine is bettery WBC improving Intake/Output from previous day: 02/07 0701 - 02/08 0700 In: 130 [P.O.:130] Out: 1350 [Urine:1350] Intake/Output this shift:    General appearance: alert, cooperative and no distress  Lab Results:   Recent Labs  07/23/14 0526 07/25/14 0520  WBC 18.6* 16.3*  HGB 13.9 13.5  HCT 42.4 41.9  PLT 217 216    BMET  Recent Labs  07/24/14 0557 07/25/14 0520  NA 139 137  K 3.5 3.6  CL 104 102  CO2 26 27  GLUCOSE 100* 89  BUN 27* 25*  CREATININE 1.28 1.21  CALCIUM 8.7 8.5   PT/INR No results for input(s): LABPROT, INR in the last 72 hours.   Recent Labs Lab 07/20/14 1713 07/21/14 0530  AST 73* 54*  ALT 32 30  ALKPHOS 101 84  BILITOT 1.1 0.9  PROT 8.0 6.6  ALBUMIN 3.4* 2.7*     Lipase  No results found for: LIPASE   Studies/Results: No results found.  Medications: . aspirin  325 mg Oral Daily  . divalproex  125 mg Oral BID  . donepezil  10 mg Oral QHS  . feeding supplement (ENSURE COMPLETE)  237 mL Oral BID BM  . memantine  10 mg Oral BID  . multivitamin with minerals  1 tablet Oral Daily  . sodium chloride  3 mL Intravenous Q12H  . vancomycin  750 mg Intravenous Q12H    Assessment/Plan Sepsis Infected necrotic decubitus ulcer Chronic AF Stage III chronic renal disease Advanced Alzheimer's disease Bilateral hydronephrosis    Plan:  He  Is on a DIII  diet, will resume for now.  Continue dressing changes, and antibiotics, plan surgery for tomorrow.    LOS: 5 days    Jackolyn Geron 07/25/2014   

## 2014-07-25 NOTE — Progress Notes (Signed)
Clinical Social Work  CSW went to room but patient sleeping and no family present. CSW spoke with son Nadine Counts([name]) who confirms he has chosen [facility]hab since [facility] is out of network with his insurance. CSW updated SNF of patient's acceptance of bed offer. CSW will continue to follow.  TyroneHolly Gaberiel Youngblood, [place] [phone]

## 2014-07-25 NOTE — Progress Notes (Signed)
PROGRESS NOTE    Darryl Summers WUJ:[id number] DOB: 01/03/1925 DOA: 07/20/2014 PCP: No primary care provider on file.  HPI/Brief narrative 81 year old male patient, SNF resident, with history of moderate to advanced Alzheimer's disease, A. fib not on anticoagulation, HTN, stroke, presented to the ED with weakness and found to have sepsis secondary to infected sacral decubitus ulcers. Surgery consulted. Patient is status post I&D on 07/21/14 with plans for further debridement in OR.   Subjective: Quite confused. No complaints.   Assessment/Plan:  1. Sepsis, present on admission: Secondary to infected sacral decubitus ulcers. Treated with IV fluid hydration and antibiotics. Sepsis physiology have improved. Blood cultures 2: Negative to date. Urine culture negative.   2. Necrotic/Infected sacral decubitus ulcer/ MRSA: A CT scan performed on 07/20/2014 showed diffuse soft tissue inflammation around the anorectal canal which could be consistent with acute infection of sacral decubitus ulcer. There is no evidence of abscess or involvement of bones per radiology report. General surgery was consulted as he underwent bedside debridement on 07/21/2014. Wound cultures drawn on 07/21/2014 showing abundant Staphylococcus aureus with susceptibility testing showing MRSA. Blood cultures remain negative. Will likely undergo debridement in OR as recommended by general surgery. Continue IV Vancomycin which was started on 2/3- can transition to Bactrim or Doxy on d/c according to sensitivities. Add Prostat to aid wound healing.                                   3. Hypokalemia- adequately replaced  4. Chronic atrial fibrillation: Not on rate control medications PTA. Not on anticoagulation despite high CHA2DS2- VASc score 5,  5. Acute on stage III chronic kidney disease: Likely secondary to sepsis, hypovolemia. Resolved 6.  7. Advanced Alzheimer's disease: Pleasantly confused but no behavioral disturbances.  Continue Aricept and Namenda. Mental status at baseline. Continue divalproex.                                                                   8. Essential hypertension: Controlled without home medications. Monitor.                                                              9. Incidental cholelithiasis: Noted on CT. Patient asymptomatic.  10. Leukocytosis: Secondary to problem #1 and 2. Gradually improving. Follow CBCs.   11. Mild bilateral hydronephrosis Seen on CT. Possibly from urinary retention? Related to BPH. Continue Foley catheter for now to avoid soiling of necrotic/infected sacral wound.    Code Status: DNR. Confirmed with family by Myra RudeEmina Riebok, Surgery ANP-BC.  Family Communication: None at bedside. Disposition Plan: Return to SNF memory care unit when medically stable.   Consultants:  General surgery  Procedures:  Bedside debridement of necrotic/infected sacral wound on 07/21/14  Foley catheter  Antibiotics:  IV vancomycin 07/20/14   IV Zosyn 07/20/14 stopped on 07/24/2014    Objective: Filed Vitals:   07/24/14 2029 07/25/14 0008 07/25/14 0445 07/25/14 1111  BP: 139/77 157/69 145/87 139/62  Pulse:  67 63 78 81  Temp: 97.5 F (36.4 C) 97.5 F (36.4 C) 98.1 F (36.7 C) 97.5 F (36.4 C)  TempSrc: Oral Oral Oral Oral  Resp: Height:      Weight:      SpO2: 98% 100% 99% 96%    Intake/Output Summary (Last 24 hours) at 07/25/14 1113 Last data filed at 07/25/14 0656  Gross per 24 hour  Intake    100 ml  Output    900 ml  Net   -800 ml   Filed Weights   07/20/14 1725  Weight: 79.379 kg (175 lb)     Exam:  General exam: Pleasant elderly male lying comfortably supine in bed.  Respiratory system: Clear. No increased work of breathing. Cardiovascular system: S1 & S2 heard, RRR. No JVD, murmurs, gallops, clicks or pedal edema.Telemetry: Atrial fibrillation with controlled ventricular rate.  Gastrointestinal system: Abdomen is nondistended,  soft and nontender. Normal bowel sounds heard. Foley +  Central nervous system: Alert and oriented to self and partly to place. No focal neurological deficits. Follows instructions.  Extremities: Symmetric 5 x 5 power. Skin: sacral decub evaluated- deep wound on left buttock with copious yellow drainage on dressing.   Data Reviewed: Basic Metabolic Panel:  Recent Labs Lab 07/21/14 0530 07/22/14 0542 07/23/14 0526 07/24/14 0557 07/25/14 0520  NA 136 138 138 139 137  K 4.0 3.7 2.9* 3.5 3.6  CL 103 106 105 104 102  CO2 GLUCOSE 113* 97 148* 100* 89  BUN 59* 61* 39* 27* 25*  CREATININE 2.77* 2.76* 1.52* 1.28 1.21  CALCIUM 8.7 8.9 8.5 8.7 8.5   Liver Function Tests:  Recent Labs Lab 07/20/14 1713 07/21/14 0530  AST 73* 54*  ALT 32 30  ALKPHOS 101 84  BILITOT 1.1 0.9  PROT 8.0 6.6  ALBUMIN 3.4* 2.7*   No results for input(s): LIPASE, AMYLASE in the last 168 hours. No results for input(s): AMMONIA in the last 168 hours. CBC:  Recent Labs Lab 07/20/14 1713 07/21/14 0530 07/22/14 0542 07/23/14 0526 07/25/14 0520  WBC 28.3* 28.7* 25.5* 18.6* 16.3*  NEUTROABS 25.5*  --   --   --   --   HGB 13.7 12.6* 12.7* 13.9 13.5  HCT 42.2 39.0 38.8* 42.4 41.9  MCV 96.1 95.8 95.1 94.4 94.8  PLT 203 181 185 217 216   Cardiac Enzymes: No results for input(s): CKTOTAL, CKMB, CKMBINDEX, TROPONINI in the last 168 hours. BNP (last 3 results) No results for input(s): PROBNP in the last 8760 hours. CBG: No results for input(s): GLUCAP in the last 168 hours.  Recent Results (from the past 240 hour(s))  Blood Culture (routine x 2)     Status: None (Preliminary result)   Collection Time: 07/20/14  5:12 PM  Result Value Ref Range Status   Specimen Description BLOOD BLOOD LEFT FOREARM  Final   Special Requests BOTTLES DRAWN AEROBIC AND ANAEROBIC 5 CC EA  Final   Culture   Final           BLOOD CULTURE RECEIVED NO GROWTH TO DATE CULTURE WILL BE HELD FOR 5 DAYS BEFORE  ISSUING A FINAL NEGATIVE REPORT Performed at [facility]    Report Status PENDING  Incomplete  Blood Culture (routine x 2)     Status: None (Preliminary result)   Collection Time: 07/20/14  5:20 PM  Result Value Ref Range Status   Specimen Description BLOOD LEFT ANTECUBITAL  Final   Special Requests BOTTLES DRAWN AEROBIC AND ANAEROBIC 5CC EACH  Final   Culture   Final           BLOOD CULTURE RECEIVED NO GROWTH TO DATE CULTURE WILL BE HELD FOR 5 DAYS BEFORE ISSUING A FINAL NEGATIVE REPORT Performed at [facility]    Report Status PENDING  Incomplete  Urine culture     Status: None   Collection Time: 07/20/14  6:16 PM  Result Value Ref Range Status   Specimen Description URINE, CATHETERIZED  Final   Special Requests NONE  Final   Colony Count NO GROWTH Performed at [facility]   Final   Culture NO GROWTH Performed at [facility]   Final   Report Status 07/21/2014 FINAL  Final  MRSA PCR Screening     Status: None   Collection Time: 07/21/14 12:59 AM  Result Value Ref Range Status   MRSA by PCR NEGATIVE NEGATIVE Final    Comment:        The GeneXpert MRSA Assay (FDA approved for NASAL specimens only), is one component of a comprehensive MRSA colonization surveillance program. It is not intended to diagnose MRSA infection nor to guide or monitor treatment for MRSA infections.   Wound culture     Status: None   Collection Time: 07/21/14  1:17 PM  Result Value Ref Range Status   Specimen Description SACRAL  Final   Special Requests Normal  Final   Gram Stain   Final    FEW WBC PRESENT,BOTH PMN AND MONONUCLEAR NO SQUAMOUS EPITHELIAL CELLS SEEN MODERATE GRAM POSITIVE COCCI IN PAIRS IN CLUSTERS Performed at [facility]    Culture   Final    ABUNDANT METHICILLIN RESISTANT STAPHYLOCOCCUS AUREUS Note: RIFAMPIN AND GENTAMICIN SHOULD NOT BE USED AS SINGLE DRUGS FOR TREATMENT OF STAPH INFECTIONS. This organism DOES NOT demonstrate  inducible Clindamycin resistance in vitro. CRITICAL RESULT CALLED TO, READ BACK BY AND VERIFIED WITH: KIM C 2/7  BY  REAMM Performed at [facility]    Report Status 07/24/2014 FINAL  Final   Organism ID, Bacteria METHICILLIN RESISTANT STAPHYLOCOCCUS AUREUS  Final      Susceptibility   Methicillin resistant staphylococcus aureus - MIC*    CLINDAMYCIN <=0.25 SENSITIVE Sensitive     ERYTHROMYCIN >=8 RESISTANT Resistant     GENTAMICIN <=0.5 SENSITIVE Sensitive     LEVOFLOXACIN >=8 RESISTANT Resistant     OXACILLIN >=4 RESISTANT Resistant     PENICILLIN >=0.5 RESISTANT Resistant     RIFAMPIN <=0.5 SENSITIVE Sensitive     TRIMETH/SULFA <=10 SENSITIVE Sensitive     VANCOMYCIN <=0.5 SENSITIVE Sensitive     TETRACYCLINE <=1 SENSITIVE Sensitive     * ABUNDANT METHICILLIN RESISTANT STAPHYLOCOCCUS AUREUS           Studies: No results found.      Scheduled Meds: . aspirin  325 mg Oral Daily  . divalproex  125 mg Oral BID  . donepezil  10 mg Oral QHS  . feeding supplement (ENSURE COMPLETE)  237 mL Oral BID BM  . memantine  10 mg Oral BID  . multivitamin with minerals  1 tablet Oral Daily  . sodium chloride  3 mL Intravenous Q12H  . vancomycin  750 mg Intravenous Q12H   Continuous Infusions:    Time spent: 35 minutes.   Calvert Cantor, MD [facility] Pager: [email or web address] Password [email or web address] 07/25/2014, 11:13 AM    LOS: 5 days

## 2014-07-25 NOTE — Progress Notes (Signed)
PT Cancellation Note  Patient Details Name: Darryl Summers MRN: [id number] DOB: 02/04/1925   Cancelled Treatment:    Reason Eval/Treat Not Completed: Other (comment) (pt going to OR for surgical debridement)   [facility] BryanWILLIAMS,[name] 07/25/2014, 10:20 AM

## 2014-07-26 ENCOUNTER — Inpatient Hospital Stay (HOSPITAL_COMMUNITY): Payer: Medicare HMO | Admitting: Anesthesiology

## 2014-07-26 ENCOUNTER — Encounter (HOSPITAL_COMMUNITY): Payer: Self-pay | Admitting: Anesthesiology

## 2014-07-26 ENCOUNTER — Encounter (HOSPITAL_COMMUNITY)
Admission: EM | Disposition: A | Discharge status: Skilled Nursing Facility | Payer: Self-pay | Source: Home / Self Care | Attending: Internal Medicine

## 2014-07-26 HISTORY — PX: INCISION AND DRAINAGE PERIRECTAL ABSCESS: SHX1804

## 2014-07-26 LAB — CULTURE, BLOOD (ROUTINE X 2)
Culture: NO GROWTH
Culture: NO GROWTH

## 2014-07-26 LAB — CBC
HCT: 40.5 % (ref 39.0–52.0)
Hemoglobin: 13.2 g/dL (ref 13.0–17.0)
MCH: 31.2 pg (ref 26.0–34.0)
MCHC: 32.6 g/dL (ref 30.0–36.0)
MCV: 95.7 fL (ref 78.0–100.0)
PLATELETS: 270 10*3/uL (ref 150–400)
RBC: 4.23 MIL/uL (ref 4.22–5.81)
RDW: 14.8 % (ref 11.5–15.5)
WBC: 16.1 10*3/uL — AB (ref 4.0–10.5)

## 2014-07-26 LAB — VANCOMYCIN, TROUGH: Vancomycin Tr: 21.9 ug/mL — ABNORMAL HIGH (ref 10.0–20.0)

## 2014-07-26 SURGERY — INCISION AND DRAINAGE, ABSCESS, PERIRECTAL
Anesthesia: General

## 2014-07-26 MED ORDER — MEPERIDINE HCL 50 MG/ML IJ SOLN: 6.2500 mg | INTRAMUSCULAR | Status: DC | PRN

## 2014-07-26 MED ORDER — PROMETHAZINE HCL 25 MG/ML IJ SOLN: 6.2500 mg | INTRAMUSCULAR | Status: DC | PRN

## 2014-07-26 MED ORDER — PROPOFOL 10 MG/ML IV BOLUS
INTRAVENOUS | Status: AC
  Filled 2014-07-26: qty 20

## 2014-07-26 MED ORDER — PROPOFOL 10 MG/ML IV BOLUS
INTRAVENOUS | Status: DC | PRN
  Administered 2014-07-26: 50 mg via INTRAVENOUS

## 2014-07-26 MED ORDER — LIDOCAINE HCL (CARDIAC) 20 MG/ML IV SOLN
INTRAVENOUS | Status: DC | PRN
  Administered 2014-07-26: 40 mg via INTRAVENOUS

## 2014-07-26 MED ORDER — 0.9 % SODIUM CHLORIDE (POUR BTL) OPTIME
TOPICAL | Status: DC | PRN
  Administered 2014-07-26: 50 mL

## 2014-07-26 MED ORDER — FENTANYL CITRATE 0.05 MG/ML IJ SOLN
INTRAMUSCULAR | Status: DC | PRN
  Administered 2014-07-26: 25 ug via INTRAVENOUS

## 2014-07-26 MED ORDER — SODIUM CHLORIDE 0.9 % IV SOLN
INTRAVENOUS | Status: DC | PRN
  Administered 2014-07-26: 10:00:00 via INTRAVENOUS

## 2014-07-26 MED ORDER — ETOMIDATE 2 MG/ML IV SOLN
INTRAVENOUS | Status: AC
  Filled 2014-07-26: qty 10

## 2014-07-26 MED ORDER — SUCCINYLCHOLINE CHLORIDE 20 MG/ML IJ SOLN
INTRAMUSCULAR | Status: DC | PRN
  Administered 2014-07-26: 100 mg via INTRAVENOUS

## 2014-07-26 MED ORDER — FENTANYL CITRATE 0.05 MG/ML IJ SOLN: 25.0000 ug | INTRAMUSCULAR | Status: DC | PRN

## 2014-07-26 MED ORDER — LIDOCAINE HCL (CARDIAC) 20 MG/ML IV SOLN
INTRAVENOUS | Status: AC
  Filled 2014-07-26: qty 5

## 2014-07-26 MED ORDER — ONDANSETRON HCL 4 MG/2ML IJ SOLN
INTRAMUSCULAR | Status: AC
  Filled 2014-07-26: qty 2

## 2014-07-26 MED ORDER — FENTANYL CITRATE 0.05 MG/ML IJ SOLN
INTRAMUSCULAR | Status: AC
  Filled 2014-07-26: qty 2

## 2014-07-26 MED ORDER — ETOMIDATE 2 MG/ML IV SOLN
INTRAVENOUS | Status: DC | PRN
  Administered 2014-07-26: 8 mg via INTRAVENOUS

## 2014-07-26 SURGICAL SUPPLY — 21 items
BLADE SURG 15 STRL LF DISP TIS (BLADE) ×1 IMPLANT
BLADE SURG 15 STRL SS (BLADE) ×2
CLEANER TIP ELECTROSURG 2X2 (MISCELLANEOUS) IMPLANT
COVER SURGICAL LIGHT HANDLE (MISCELLANEOUS) ×3 IMPLANT
DRSG PAD ABDOMINAL 8X10 ST (GAUZE/BANDAGES/DRESSINGS) ×3 IMPLANT
ELECT REM PT RETURN 9FT ADLT (ELECTROSURGICAL)
ELECTRODE REM PT RTRN 9FT ADLT (ELECTROSURGICAL) IMPLANT
GAUZE PACKING IODOFORM 1X5 (MISCELLANEOUS) IMPLANT
GAUZE SPONGE 4X4 12PLY STRL (GAUZE/BANDAGES/DRESSINGS) ×3 IMPLANT
GAUZE SPONGE 4X4 16PLY XRAY LF (GAUZE/BANDAGES/DRESSINGS) ×3 IMPLANT
GLOVE BIO SURGEON STRL SZ7.5 (GLOVE) ×3 IMPLANT
GOWN STRL REUS W/TWL LRG LVL3 (GOWN DISPOSABLE) ×6 IMPLANT
KIT BASIN OR (CUSTOM PROCEDURE TRAY) ×3 IMPLANT
NS IRRIG 1000ML POUR BTL (IV SOLUTION) ×3 IMPLANT
PACK LITHOTOMY IV (CUSTOM PROCEDURE TRAY) ×3 IMPLANT
PENCIL BUTTON HOLSTER BLD 10FT (ELECTRODE) IMPLANT
SWAB COLLECTION DEVICE MRSA (MISCELLANEOUS) ×3 IMPLANT
TOWEL OR 17X26 10 PK STRL BLUE (TOWEL DISPOSABLE) ×3 IMPLANT
TUBE ANAEROBIC SPECIMEN COL (MISCELLANEOUS) ×3 IMPLANT
UNDERPAD 30X30 INCONTINENT (UNDERPADS AND DIAPERS) ×3 IMPLANT
YANKAUER SUCT BULB TIP NO VENT (SUCTIONS) ×3 IMPLANT

## 2014-07-26 NOTE — Progress Notes (Signed)
ANTIBIOTIC CONSULT NOTE - FOLLOW UP  Pharmacy Consult for Vancomycin Indication: MRSA wound infection  No Known Allergies  Patient Measurements: Height: 5\' 10"  (177.8 cm) Weight: 175 lb (79.379 kg) IBW/kg (Calculated) : 73  Vital Signs: Temp: 98.1 F (36.7 C) (02/09 1145) Temp Source: Oral (02/09 0542) BP: 163/69 mmHg (02/09 1145) Pulse Rate: 68 (02/09 1145) Intake/Output from previous day: 02/08 0701 - 02/09 0700 In: 540 [P.O.:390; IV Piggyback:150] Out: 1250 [Urine:1250] Intake/Output from this shift: Total I/O In: 550 [I.V.:550] Out: 300 [Urine:300]  Labs:  Recent Labs  07/24/14 0557 07/25/14 0520 07/26/14 0535  WBC  --  16.3* 16.1*  HGB  --  13.5 13.2  PLT  --  216 270  CREATININE 1.28 1.21  --    Estimated Creatinine Clearance: 44.4 mL/min (by C-G formula based on Cr of 1.21). No results for input(s): VANCOTROUGH, VANCOPEAK, VANCORANDOM, GENTTROUGH, GENTPEAK, GENTRANDOM, TOBRATROUGH, TOBRAPEAK, TOBRARND, AMIKACINPEAK, AMIKACINTROU, AMIKACIN in the last 72 hours.    Assessment: 81 yo male with hx of dementia who presents from his nursing facility due to worsening generalized weakness and inability to ambulate.Pt has a sacral stage 2 pressure ulcer that is oozing serosanguinous fluid, necrotic, and infected.  S/p bedside I&D on 2/4 with sacral wound culture growing abundant MRSA.  Pharmacy is consulted to dose vancomycin and Zosyn.  2/3 >> zosyn >> 2/7 2/3 >> Vanc >>  Today, 07/26/2014:   Day #7 Vancomycin on dose 750mg  IV q12  Tmax: afebrile  WBC: elevated 16.1 and unchanged  Renal: SCr significantly improved to 1.21, CrCl 44 ml/min  S/p I&D today in OR   Goal of Therapy:  Vancomycin trough level 15-20 mcg/ml  Plan:  1) Continue vancomycin 750mg  IV q12 for now 2) Check vanc trough tonight prior to next dose.   Hessie KnowsJustin M Elizjah Noblet, PharmD, BCPS Pager [phone] 07/26/2014 1:37 PM

## 2014-07-26 NOTE — Progress Notes (Signed)
Clinical Social Work  [facility] remains agreeable to accept patient whenever medically stable. SNF requires updated therapy notes for insurance authorization. CSW will continue to follow.  MillervilleHolly Vieno [place], [place] [phone]

## 2014-07-26 NOTE — Anesthesia Preprocedure Evaluation (Addendum)
Anesthesia Evaluation  Patient identified by MRN, date of birth, ID band Patient awake    Reviewed: Allergy & Precautions, NPO status , Patient's Chart, lab work & pertinent test results  History of Anesthesia Complications (+) AWARENESS UNDER ANESTHESIA  Airway Mallampati: II  TM Distance: >3 FB Neck ROM: Full    Dental no notable dental hx.    Pulmonary neg pulmonary ROS,  breath sounds clear to auscultation  Pulmonary exam normal       Cardiovascular hypertension, Pt. on medications + dysrhythmias Atrial Fibrillation + Valvular Problems/Murmurs AI and MR Rhythm:Regular Rate:Normal     Neuro/Psych Alzheimer's Dementia CVA negative psych ROS   GI/Hepatic negative GI ROS, Neg liver ROS,   Endo/Other  negative endocrine ROS  Renal/GU Renal InsufficiencyRenal disease  negative genitourinary   Musculoskeletal negative musculoskeletal ROS (+)   Abdominal   Peds negative pediatric ROS (+)  Hematology negative hematology ROS (+)   Anesthesia Other Findings   Reproductive/Obstetrics negative OB ROS                           Anesthesia Physical Anesthesia Plan  ASA: III  Anesthesia Plan: General   Post-op Pain Management:    Induction: Intravenous  Airway Management Planned: Oral ETT  Additional Equipment:   Intra-op Plan:   Post-operative Plan: Extubation in OR  Informed Consent: I have reviewed the patients History and Physical, chart, labs and discussed the procedure including the risks, benefits and alternatives for the proposed anesthesia with the patient or authorized representative who has indicated his/her understanding and acceptance.   Dental advisory given  Plan Discussed with: CRNA  Anesthesia Plan Comments:         Anesthesia Quick Evaluation

## 2014-07-26 NOTE — Interval H&P Note (Signed)
History and Physical Interval Note:  07/26/2014 9:50 AM  Darryl Summers  has presented today for surgery, with the diagnosis of sacral decubitus  The various methods of treatment have been discussed with the patient and family. After consideration of risks, benefits and other options for treatment, the patient has consented to  Procedure(s) with comments: debridement of sacral wound  (N/A) - prone as a surgical intervention .  The patient's history has been reviewed, patient examined, no change in status, stable for surgery.  I have reviewed the patient's chart and labs.  Questions were answered to the patient's satisfaction.     TOTH III,PAUL S

## 2014-07-26 NOTE — Progress Notes (Signed)
SLP Cancellation Note  Patient Details Name: Lorrin MaisRyland Kester Oberle MRN: [id number] DOB: 02/25/1925   Cancelled treatment:       Reason Eval/Treat Not Completed: Other (comment) (pt for surgery today per chart review)   Donavan Burnetamara Diamante Rubin, MS [facility] SLP [phone]

## 2014-07-26 NOTE — H&P (View-Only) (Signed)
  Subjective: He looks fine.  I told him we are going to have to do his surgery tomorrow.  He is agreeable to that.  Ask what we were planning to do.  His pastor and friend of many years is with him now.  He is going to tell his son.  Objective: Vital signs in last 24 hours: Temp:  [97.5 F (36.4 C)-98.6 F (37 C)] 98.1 F (36.7 C) (02/08 0445) Pulse Rate:  [63-78] 78 (02/08 0445) Resp:  [18] 18 (02/08 0445) BP: (137-157)/(69-91) 145/87 mmHg (02/08 0445) SpO2:  [98 %-100 %] 99 % (02/08 0445) Last BM Date: 07/22/14 PO 130 recorded yesterday Afebrile, VSS Creatinine is bettery WBC improving Intake/Output from previous day: 02/07 0701 - 02/08 0700 In: 130 [P.O.:130] Out: 1350 [Urine:1350] Intake/Output this shift:    General appearance: alert, cooperative and no distress  Lab Results:   Recent Labs  07/23/14 0526 07/25/14 0520  WBC 18.6* 16.3*  HGB 13.9 13.5  HCT 42.4 41.9  PLT 217 216    BMET  Recent Labs  07/24/14 0557 07/25/14 0520  NA 139 137  K 3.5 3.6  CL 104 102  CO2 26 27  GLUCOSE 100* 89  BUN 27* 25*  CREATININE 1.28 1.21  CALCIUM 8.7 8.5   PT/INR No results for input(s): LABPROT, INR in the last 72 hours.   Recent Labs Lab 07/20/14 1713 07/21/14 0530  AST 73* 54*  ALT 32 30  ALKPHOS 101 84  BILITOT 1.1 0.9  PROT 8.0 6.6  ALBUMIN 3.4* 2.7*     Lipase  No results found for: LIPASE   Studies/Results: No results found.  Medications: . aspirin  325 mg Oral Daily  . divalproex  125 mg Oral BID  . donepezil  10 mg Oral QHS  . feeding supplement (ENSURE COMPLETE)  237 mL Oral BID BM  . memantine  10 mg Oral BID  . multivitamin with minerals  1 tablet Oral Daily  . sodium chloride  3 mL Intravenous Q12H  . vancomycin  750 mg Intravenous Q12H    Assessment/Plan Sepsis Infected necrotic decubitus ulcer Chronic AF Stage III chronic renal disease Advanced Alzheimer's disease Bilateral hydronephrosis    Plan:  He  Is on a DIII  diet, will resume for now.  Continue dressing changes, and antibiotics, plan surgery for tomorrow.    LOS: 5 days    Darryl Summers 07/25/2014

## 2014-07-26 NOTE — Progress Notes (Signed)
OT Cancellation Note  Patient Details Name: Darryl Summers MRN: [id number] DOB: 05/07/1925   Cancelled Treatment:    Reason Eval/Treat Not Completed: Other (pt for surgery today per chart review)  [name] A 07/26/2014, 9:10 AM

## 2014-07-26 NOTE — Progress Notes (Addendum)
PROGRESS NOTE    Darryl Summers YNW:[id number] DOB: 08/22/1924 DOA: 07/20/2014 PCP: No primary care provider on file.  HPI/Brief narrative 81 year old male patient, SNF resident, with history of moderate to advanced Alzheimer's disease, A. fib not on anticoagulation, HTN, stroke, presented to the ED with weakness and found to have sepsis secondary to infected sacral decubitus ulcers. Surgery consulted. Patient is status post I&D on 07/21/14 with plans for further debridement in OR.   Subjective: Quite confused but calm and cooperative  Assessment/Plan:  1. Sepsis, present on admission: Secondary to infected sacral decubitus ulcers. Treated with IV fluid hydration and antibiotics. Sepsis physiology have improved. Blood cultures 2: Negative to date. Urine culture negative.   2. Necrotic/Infected sacral decubitus ulcer/ MRSA: A CT scan performed on 07/20/2014 showed diffuse soft tissue inflammation around the anorectal canal which could be consistent with acute infection of sacral decubitus ulcer. There is no evidence of abscess or involvement of bones per radiology report. General surgery was consulted as he underwent bedside debridement on 07/21/2014. Wound cultures drawn on 07/21/2014 showing abundant Staphylococcus aureus with susceptibility testing showing MRSA. Blood cultures remain negative. Will likely undergo debridement in OR as recommended by general surgery. Continue IV Vancomycin which was started on 2/3- can transition to Bactrim or Doxy on d/c according to sensitivities. Add Prostat to aid wound healing.                      3. Hypokalemia- adequately replaced  4. Chronic atrial fibrillation: Not on rate control medications PTA. Not on anticoagulation despite high CHA2DS2- VASc score 5,  5. Acute on stage III chronic kidney disease: Likely secondary to sepsis, hypovolemia. Resolved  6. Advanced Alzheimer's disease: Pleasantly confused but no behavioral disturbances. Continue  Aricept and Namenda. Mental status at baseline. Continue divalproex.                                                                   7. Essential hypertension: Controlled without home medications. Monitor.                                                              8. Incidental cholelithiasis: Noted on CT. Patient asymptomatic.  9. Leukocytosis: Secondary to problem #1 and 2. Gradually improving. May resolve after debridement done today. Follow CBCs.   10. Mild bilateral hydronephrosis Seen on CT. Possibly from urinary retention? Related to BPH. Continue Foley catheter for now to avoid soiling of necrotic/infected sacral wound.    Code Status: DNR. Confirmed with family by Myra RudeEmina Riebok, Surgery ANP-BC.  Family Communication: None at bedside. Disposition Plan: Return to SNF memory care unit when medically stable.   Consultants:  General surgery  Procedures:  Bedside debridement of necrotic/infected sacral wound on 07/21/14  Foley catheter  Antibiotics:  IV vancomycin 07/20/14   IV Zosyn 07/20/14 stopped on 07/24/2014    Objective: Filed Vitals:   07/26/14 1053 07/26/14 1115 07/26/14 1135 07/26/14 1145  BP: 132/66 147/65  163/69  Pulse: 63 74 64 68  Temp: 98  F (36.7 C)   98.1 F (36.7 C)  TempSrc:      Resp: Height:      Weight:      SpO2: 100% 100%  100%    Intake/Output Summary (Last 24 hours) at 07/26/14 1215 Last data filed at 07/26/14 1123  Gross per 24 hour  Intake   1090 ml  Output   1550 ml  Net   -460 ml   Filed Weights   07/20/14 1725  Weight: 79.379 kg (175 lb)     Exam:  General exam: Pleasant elderly male lying comfortably supine in bed.  Respiratory system: Clear. No increased work of breathing. Cardiovascular system: S1 & S2 heard, RRR. No JVD, murmurs, gallops, clicks or pedal edema.Telemetry: Atrial fibrillation with controlled ventricular rate.  Gastrointestinal system: Abdomen is nondistended, soft and nontender. Normal  bowel sounds heard. Foley +  Central nervous system: Alert and oriented to self and partly to place. No focal neurological deficits. Follows instructions.  Extremities: Symmetric 5 x 5 power. Skin: sacral decub evaluated- deep wound on left buttock with copious yellow drainage on dressing.   Data Reviewed: Basic Metabolic Panel:  Recent Labs Lab 07/21/14 0530 07/22/14 0542 07/23/14 0526 07/24/14 0557 07/25/14 0520  NA 136 138 138 139 137  K 4.0 3.7 2.9* 3.5 3.6  CL 103 106 105 104 102  CO2 GLUCOSE 113* 97 148* 100* 89  BUN 59* 61* 39* 27* 25*  CREATININE 2.77* 2.76* 1.52* 1.28 1.21  CALCIUM 8.7 8.9 8.5 8.7 8.5   Liver Function Tests:  Recent Labs Lab 07/20/14 1713 07/21/14 0530  AST 73* 54*  ALT 32 30  ALKPHOS 101 84  BILITOT 1.1 0.9  PROT 8.0 6.6  ALBUMIN 3.4* 2.7*   No results for input(s): LIPASE, AMYLASE in the last 168 hours. No results for input(s): AMMONIA in the last 168 hours. CBC:  Recent Labs Lab 07/20/14 1713 07/21/14 0530 07/22/14 0542 07/23/14 0526 07/25/14 0520 07/26/14 0535  WBC 28.3* 28.7* 25.5* 18.6* 16.3* 16.1*  NEUTROABS 25.5*  --   --   --   --   --   HGB 13.7 12.6* 12.7* 13.9 13.5 13.2  HCT 42.2 39.0 38.8* 42.4 41.9 40.5  MCV 96.1 95.8 95.1 94.4 94.8 95.7  PLT 203 181 185 217 216 270   Cardiac Enzymes: No results for input(s): CKTOTAL, CKMB, CKMBINDEX, TROPONINI in the last 168 hours. BNP (last 3 results) No results for input(s): PROBNP in the last 8760 hours. CBG: No results for input(s): GLUCAP in the last 168 hours.  Recent Results (from the past 240 hour(s))  Blood Culture (routine x 2)     Status: None   Collection Time: 07/20/14  5:12 PM  Result Value Ref Range Status   Specimen Description BLOOD BLOOD LEFT FOREARM  Final   Special Requests BOTTLES DRAWN AEROBIC AND ANAEROBIC 5 CC EA  Final   Culture   Final    NO GROWTH 5 DAYS Performed at [facility]    Report Status 07/26/2014 FINAL   Final  Blood Culture (routine x 2)     Status: None   Collection Time: 07/20/14  5:20 PM  Result Value Ref Range Status   Specimen Description BLOOD LEFT ANTECUBITAL  Final   Special Requests BOTTLES DRAWN AEROBIC AND ANAEROBIC 5CC EACH  Final   Culture   Final    NO GROWTH 5 DAYS Performed at [facility]    Report Status 07/26/2014 FINAL  Final  Urine culture     Status: None   Collection Time: 07/20/14  6:16 PM  Result Value Ref Range Status   Specimen Description URINE, CATHETERIZED  Final   Special Requests NONE  Final   Colony Count NO GROWTH Performed at [facility]   Final   Culture NO GROWTH Performed at [facility]   Final   Report Status 07/21/2014 FINAL  Final  MRSA PCR Screening     Status: None   Collection Time: 07/21/14 12:59 AM  Result Value Ref Range Status   MRSA by PCR NEGATIVE NEGATIVE Final    Comment:        The GeneXpert MRSA Assay (FDA approved for NASAL specimens only), is one component of a comprehensive MRSA colonization surveillance program. It is not intended to diagnose MRSA infection nor to guide or monitor treatment for MRSA infections.   Wound culture     Status: None   Collection Time: 07/21/14  1:17 PM  Result Value Ref Range Status   Specimen Description SACRAL  Final   Special Requests Normal  Final   Gram Stain   Final    FEW WBC PRESENT,BOTH PMN AND MONONUCLEAR NO SQUAMOUS EPITHELIAL CELLS SEEN MODERATE GRAM POSITIVE COCCI IN PAIRS IN CLUSTERS Performed at [facility]    Culture   Final    ABUNDANT METHICILLIN RESISTANT STAPHYLOCOCCUS AUREUS Note: RIFAMPIN AND GENTAMICIN SHOULD NOT BE USED AS SINGLE DRUGS FOR TREATMENT OF STAPH INFECTIONS. This organism DOES NOT demonstrate inducible Clindamycin resistance in vitro. CRITICAL RESULT CALLED TO, READ BACK BY AND VERIFIED WITH: KIM C 2/7  BY  REAMM Performed at [facility]    Report Status 07/24/2014 FINAL  Final   Organism ID,  Bacteria METHICILLIN RESISTANT STAPHYLOCOCCUS AUREUS  Final      Susceptibility   Methicillin resistant staphylococcus aureus - MIC*    CLINDAMYCIN <=0.25 SENSITIVE Sensitive     ERYTHROMYCIN >=8 RESISTANT Resistant     GENTAMICIN <=0.5 SENSITIVE Sensitive     LEVOFLOXACIN >=8 RESISTANT Resistant     OXACILLIN >=4 RESISTANT Resistant     PENICILLIN >=0.5 RESISTANT Resistant     RIFAMPIN <=0.5 SENSITIVE Sensitive     TRIMETH/SULFA <=10 SENSITIVE Sensitive     VANCOMYCIN <=0.5 SENSITIVE Sensitive     TETRACYCLINE <=1 SENSITIVE Sensitive     * ABUNDANT METHICILLIN RESISTANT STAPHYLOCOCCUS AUREUS           Studies: No results found.      Scheduled Meds: . [MAR Hold] aspirin  325 mg Oral Daily  . [MAR Hold] divalproex  125 mg Oral BID  . [MAR Hold] donepezil  10 mg Oral QHS  . [MAR Hold] feeding supplement (ENSURE COMPLETE)  237 mL Oral BID BM  . [MAR Hold] feeding supplement (PRO-STAT SUGAR FREE 64)  30 mL Oral TID WC  . [MAR Hold] memantine  10 mg Oral BID  . [MAR Hold] multivitamin with minerals  1 tablet Oral Daily  . [MAR Hold] sodium chloride  3 mL Intravenous Q12H  . [MAR Hold] vancomycin  750 mg Intravenous Q12H   Continuous Infusions:    Time spent: 35 minutes.   Calvert Cantor, MD [facility] Pager: [email or web address] Password [email or web address] 07/26/2014, 12:15 PM    LOS: 6 days

## 2014-07-26 NOTE — Clinical Documentation Improvement (Addendum)
Dr. Marla RoePeter Toth  III   Please clarify documentation in the medical record of "debridement."  Please document the below (4) key elements in a progress note:  Depth of debridement/type of tissue removed? (e.g., skin, subcutaneous tissue, fascia, muscle, bone, other)  Excisional vs nonexcisional       Treatment: s/p surgical debridement   Thank You,  Lavonda JumboLawanda J Lucyle Alumbaugh ,RN Clinical Documentation Specialist:  [phone]  [facility]- [facility] Information Management

## 2014-07-26 NOTE — Transfer of Care (Signed)
Immediate Anesthesia Transfer of Care Note  Patient: Darryl Summers  Procedure(s) Performed: Procedure(s) with comments: debridement of sacral wound  (N/A) - prone  Patient Location: PACU  Anesthesia Type:General  Level of Consciousness: awake, alert  and oriented  Airway & Oxygen Therapy: Patient Spontanous Breathing and Patient connected to face mask oxygen  Post-op Assessment: Report given to RN and Post -op Vital signs reviewed and stable  Post vital signs: Reviewed and stable  Last Vitals:  Filed Vitals:   07/26/14 0542  BP: 152/62  Pulse: 55  Temp: 36.3 C  Resp: 20    Complications: No apparent anesthesia complications

## 2014-07-26 NOTE — Plan of Care (Signed)
Problem: Phase I Progression Outcomes Goal: Voiding-avoid urinary catheter unless indicated Outcome: Not Progressing Pt with sacral decub

## 2014-07-26 NOTE — Anesthesia Postprocedure Evaluation (Signed)
  Anesthesia Post-op Note  Patient: Darryl Summers  Procedure(s) Performed: Procedure(s) (LRB): debridement of sacral wound  (N/A)  Patient Location: PACU  Anesthesia Type: General  Level of Consciousness: awake and alert   Airway and Oxygen Therapy: Patient Spontanous Breathing  Post-op Pain: mild  Post-op Assessment: Post-op Vital signs reviewed, Patient's Cardiovascular Status Stable, Respiratory Function Stable, Patent Airway and No signs of Nausea or vomiting  Last Vitals:  Filed Vitals:   07/26/14 1520  BP: 143/65  Pulse: 7  Temp: 36.7 C  Resp: 18    Post-op Vital Signs: stable   Complications: No apparent anesthesia complications

## 2014-07-26 NOTE — Op Note (Addendum)
07/20/2014 - 07/26/2014  10:48 AM  PATIENT:  Darryl Summers  81 y.o. male  PRE-OPERATIVE DIAGNOSIS:  sacral decubitus  POST-OPERATIVE DIAGNOSIS:  sacral decubitus  PROCEDURE:  Procedure(s) with comments: debridement of sacral wound  (N/A) - prone  SURGEON:  Surgeon(s) and Role:    * Griselda MinerPaul Toth III, MD - Primary  PHYSICIAN ASSISTANT:   ASSISTANTS: none   ANESTHESIA:   general  EBL:  Total I/O In: -  Out: 300 [Urine:300]  BLOOD ADMINISTERED:none  DRAINS: none   LOCAL MEDICATIONS USED:  NONE  SPECIMEN:  No Specimen  DISPOSITION OF SPECIMEN:  N/A  COUNTS:  YES  TOURNIQUET:  * No tourniquets in log *  DICTATION: .Dragon Dictation  After informed consent was obtained the patient was brought to the operating room and left in the supine position on the stretcher. After adequate induction of general anesthesia the patient was moved into a prone position on the operating room table, pressure points are padded. The sacral and perirectal area were prepped with Betadine and draped in usual sterile manner. The patient had an open wound just to the right of his rectum and gluteal cleft. This wound measured approximately 3 cm wide by 5 cm long. The wound tunneled anteriorly. The skin overlying the tunnel was incised sharply with the electrocautery. There were 2 deep tracts in the right perirectal space. These wounds were debrided by a combination of blunt finger dissection and some sharp dissection with the electrocautery. The dead tissue was removed. Hemostasis was achieved using the Bovie electrocautery. Each wound tracked approximately 10 cm deep. Cultures were obtained. All of the tracts and open wound were then packed with moistened Kerlix gauze. Sterile dressings were applied. The patient tolerated the procedure well. At the end of the case all needle sponges counts are correct. The patient was then awakened and taken to recovery in stable condition. 1.  Progress note or procedure  note with a detailed description of the procedure.  2.  Tool used for debridement (curette, scapel, etc.)  debakey forceps and cautery  3.  Frequency of surgical debridement.   once  4.  Measurement of total devitalized tissue (wound surface) before and after surgical debridement.   3X5cm  5.  Area and depth of devitalized tissue removed from wound.  3X5X10cm  6.  Blood loss and description of tissue removed.  Minimal blood loss and white dead tissue  7.  Evidence of the progress of the wound's response to treatment.  A.  Current wound volume (current dimensions and depth).  As above  B.  Presence (and extent of) of infection.  yes  C.  Presence (and extent of) of non viable tissue.  Small amout of dead tissue  D.  Other material in the wound that is expected to inhibit healing.  none  8.  Was there any viable tissue removed (measurements): no PLAN OF CARE: Admit to inpatient   PATIENT DISPOSITION:  PACU - hemodynamically stable.   Delay start of Pharmacological VTE agent (>24hrs) due to surgical blood loss or risk of bleeding: no

## 2014-07-27 ENCOUNTER — Encounter (HOSPITAL_COMMUNITY): Payer: Self-pay | Admitting: General Surgery

## 2014-07-27 LAB — CBC
HCT: 42.5 % (ref 39.0–52.0)
HEMOGLOBIN: 14 g/dL (ref 13.0–17.0)
MCH: 31.4 pg (ref 26.0–34.0)
MCHC: 32.9 g/dL (ref 30.0–36.0)
MCV: 95.3 fL (ref 78.0–100.0)
PLATELETS: 257 10*3/uL (ref 150–400)
RBC: 4.46 MIL/uL (ref 4.22–5.81)
RDW: 14.5 % (ref 11.5–15.5)
WBC: 16 10*3/uL — ABNORMAL HIGH (ref 4.0–10.5)

## 2014-07-27 LAB — BASIC METABOLIC PANEL
Anion gap: 9 (ref 5–15)
BUN: 23 mg/dL (ref 6–23)
CALCIUM: 8.4 mg/dL (ref 8.4–10.5)
CO2: 27 mmol/L (ref 19–32)
Chloride: 102 mmol/L (ref 96–112)
Creatinine, Ser: 1.23 mg/dL (ref 0.50–1.35)
GFR calc Af Amer: 59 mL/min — ABNORMAL LOW (ref >90)
GFR, EST NON AFRICAN AMERICAN: 51 mL/min — AB (ref >90)
GLUCOSE: 105 mg/dL — AB (ref 70–99)
Potassium: 3.8 mmol/L (ref 3.5–5.1)
SODIUM: 138 mmol/L (ref 135–145)

## 2014-07-27 MED ORDER — VANCOMYCIN HCL 10 G IV SOLR
1250.0000 mg | INTRAVENOUS | Status: DC
  Administered 2014-07-27: 1250 mg via INTRAVENOUS
  Filled 2014-07-27 (×2): qty 1250

## 2014-07-27 NOTE — Progress Notes (Signed)
Physical Therapy Treatment Patient Details Name: Darryl Summers MRN: [id number] DOB: 04/27/1925 Today's Date: 07/27/2014    History of Present Illness [name] is a 81 y.o. male adm with sacral decubitus, sepsis;  I & D performed 07/21/14.   past medical history:Alzheimer's dementia, which appears to be moderate to advanced, atrial fibrillation not on anticoagulation, hypertension; pt resides in memory unit., was ambulatory at baseline. has had I and D x 2.     .   PT Comments    Patient unable to tolerate sitting up at the edge of the bed due to c/o sacral pain. Patient had  I and D 2  Follow Up Recommendations  SNF     Equipment Recommendations  None recommended by PT    Recommendations for Other Services       Precautions / Restrictions Precautions Precautions: Fall Precaution Comments: has wound on sacrum    Mobility  Bed Mobility Overal bed mobility: Needs Assistance Bed Mobility: Supine to Sit           General bed mobility comments: attempted to sit  at the edge, assisted legs over the edge of the bed, unable to tolerate trunk getting into upright.  Transfers                 General transfer comment: not tested  Ambulation/Gait                 Stairs            Wheelchair Mobility    Modified Rankin (Stroke Patients Only)       Balance                                    Cognition Arousal/Alertness: Awake/alert   Overall Cognitive Status: History of cognitive impairments - at baseline       Memory: Decreased short-term memory              Exercises      General Comments        Pertinent Vitals/Pain Pain Assessment: Faces Faces Pain Scale: Hurts worst Pain Location: Patient c/o severe pain with attempts to sit up Pain Descriptors / Indicators: Crying;Grimacing;Guarding Pain Intervention(s): Patient requesting pain meds-RN notified;Limited activity within patient's tolerance     Home Living                      Prior Function            PT Goals (current goals can now be found in the care plan section) Progress towards PT goals: Progressing toward goals    Frequency  Min 3X/week    PT Plan Current plan remains appropriate    Co-evaluation             End of Session   Activity Tolerance: Patient limited by pain Patient left: in bed;with call bell/phone within reach     Time: 0820-0836 PT Time Calculation (min) (ACUTE ONLY): 16 min  Charges:  $Therapeutic Activity: 8-22 mins                    G Codes:      Rada HayHill, Yannet Rincon Elizabeth 07/27/2014, 9:37 AM Blanchard KelchKaren Reymond Maynez PT [phone]

## 2014-07-27 NOTE — Progress Notes (Signed)
PROGRESS NOTE    Darryl Summers ZOX:[id number] DOB: 1924-06-27 DOA: 07/20/2014 PCP: No primary care provider on file.  HPI/Brief narrative 81 year old male patient, SNF resident, with history of moderate to advanced Alzheimer's disease, A. fib not on anticoagulation, HTN, stroke, presented to the ED with weakness and found to have sepsis secondary to infected sacral decubitus ulcers. Surgery consulted. Patient is status post I&D on 07/21/14 with plans for further debridement in OR.   Subjective: Quite confused but calm and cooperative  Assessment/Plan:  1. Sepsis, present on admission: Secondary to infected sacral decubitus ulcers. Treated with IV fluid hydration and antibiotics. Sepsis physiology have improved. Blood cultures 2: Negative to date. Urine culture negative.   2. Necrotic/Infected sacral decubitus ulcer/ MRSA: A CT scan performed on 07/20/2014 showed diffuse soft tissue inflammation around the anorectal canal which could be consistent with acute infection of sacral decubitus ulcer. There is no evidence of abscess or involvement of bones per radiology report. General surgery was consulted as he underwent bedside debridement on 07/21/2014. Wound cultures drawn on 07/21/2014 showing abundant Staphylococcus aureus with susceptibility testing showing MRSA. Blood cultures remain negative. S/p debridement in OR on 2/9 Dr Carolynne Edouard noted 2 deep tracks. Now on air mattress. Continue IV Vancomycin which was started on 2/3- WBC count still quite high- can transition to Bactrim or Doxy on d/c according to sensitivities. Added Prostat to aid wound healing.                      3. Hypokalemia- adequately replaced  4. Chronic atrial fibrillation: Not on rate control medications PTA. Not on anticoagulation despite high CHA2DS2- VASc score 5,  5. Acute on stage III chronic kidney disease: Likely secondary to sepsis, hypovolemia. Resolved  6. Advanced Alzheimer's disease: Pleasantly confused but  no behavioral disturbances. Continue Aricept and Namenda. Mental status at baseline. Continue divalproex.                                                                   7. Essential hypertension: Controlled without home medications. Monitor.                                                              8. Incidental cholelithiasis: Noted on CT. Patient asymptomatic  9. Mild bilateral hydronephrosis Seen on CT. Possibly from urinary retention? Related to BPH. D/c Foley catheter - voiding trial- place condom cath to avoid soiling of necrotic/infected sacral wound.    Code Status: DNR. Confirmed with family by Myra Rude, Surgery ANP-BC.  Family Communication: None at bedside. Disposition Plan: Return to SNF memory care unit when medically stable.   Consultants:  General surgery  Procedures:  Bedside debridement of necrotic/infected sacral wound on 07/21/14  OR debridement on 2/9  Foley catheter  Antibiotics:  IV vancomycin 07/20/14   IV Zosyn 07/20/14 stopped on 07/24/2014    Objective: Filed Vitals:   07/26/14 1145 07/26/14 1520 07/26/14 2109 07/27/14 0523  BP: 163/69 143/65 146/70 142/72  Pulse: 68 7 89 69  Temp: 98.1 F (  36.7 C) 98.1 F (36.7 C) 99.2 F (37.3 C) 97.6 F (36.4 C)  TempSrc:  Oral Oral Oral  Resp: Height:      Weight:      SpO2: 100% 99% 100% 99%    Intake/Output Summary (Last 24 hours) at 07/27/14 1401 Last data filed at 07/27/14 0530  Gross per 24 hour  Intake    240 ml  Output   1250 ml  Net  -1010 ml   Filed Weights   07/20/14 1725  Weight: 79.379 kg (175 lb)     Exam:  General exam: Pleasant elderly male lying comfortably supine in bed.  Respiratory system: Clear. No increased work of breathing. Cardiovascular system: S1 & S2 heard, RRR. No JVD, murmurs, gallops, clicks or pedal edema.Telemetry: Atrial fibrillation with controlled ventricular rate.  Gastrointestinal system: Abdomen is nondistended, soft and nontender.  Normal bowel sounds heard. Foley +  Central nervous system: Alert and oriented to self and partly to place. No focal neurological deficits. Follows instructions.  Extremities: Symmetric 5 x 5 power.   Data Reviewed: Basic Metabolic Panel:  Recent Labs Lab 07/22/14 0542 07/23/14 0526 07/24/14 0557 07/25/14 0520 07/27/14 0935  NA 138 138 139 137 138  K 3.7 2.9* 3.5 3.6 3.8  CL 106 105 104 102 102  CO2 GLUCOSE 97 148* 100* 89 105*  BUN 61* 39* 27* 25* 23  CREATININE 2.76* 1.52* 1.28 1.21 1.23  CALCIUM 8.9 8.5 8.7 8.5 8.4   Liver Function Tests:  Recent Labs Lab 07/20/14 1713 07/21/14 0530  AST 73* 54*  ALT 32 30  ALKPHOS 101 84  BILITOT 1.1 0.9  PROT 8.0 6.6  ALBUMIN 3.4* 2.7*   No results for input(s): LIPASE, AMYLASE in the last 168 hours. No results for input(s): AMMONIA in the last 168 hours. CBC:  Recent Labs Lab 07/20/14 1713  07/22/14 0542 07/23/14 0526 07/25/14 0520 07/26/14 0535 07/27/14 0935  WBC 28.3*  < > 25.5* 18.6* 16.3* 16.1* 16.0*  NEUTROABS 25.5*  --   --   --   --   --   --   HGB 13.7  < > 12.7* 13.9 13.5 13.2 14.0  HCT 42.2  < > 38.8* 42.4 41.9 40.5 42.5  MCV 96.1  < > 95.1 94.4 94.8 95.7 95.3  PLT 203  < > 185 217 216 270 257  < > = values in this interval not displayed. Cardiac Enzymes: No results for input(s): CKTOTAL, CKMB, CKMBINDEX, TROPONINI in the last 168 hours. BNP (last 3 results) No results for input(s): PROBNP in the last 8760 hours. CBG: No results for input(s): GLUCAP in the last 168 hours.  Recent Results (from the past 240 hour(s))  Blood Culture (routine x 2)     Status: None   Collection Time: 07/20/14  5:12 PM  Result Value Ref Range Status   Specimen Description BLOOD BLOOD LEFT FOREARM  Final   Special Requests BOTTLES DRAWN AEROBIC AND ANAEROBIC 5 CC EA  Final   Culture   Final    NO GROWTH 5 DAYS Performed at [facility]    Report Status 07/26/2014 FINAL  Final  Blood Culture  (routine x 2)     Status: None   Collection Time: 07/20/14  5:20 PM  Result Value Ref Range Status   Specimen Description BLOOD LEFT ANTECUBITAL  Final   Special Requests BOTTLES DRAWN AEROBIC AND ANAEROBIC 5CC EACH  Final   Culture   Final    NO GROWTH 5 DAYS Performed at [facility]    Report Status 07/26/2014 FINAL  Final  Urine culture     Status: None   Collection Time: 07/20/14  6:16 PM  Result Value Ref Range Status   Specimen Description URINE, CATHETERIZED  Final   Special Requests NONE  Final   Colony Count NO GROWTH Performed at [facility]   Final   Culture NO GROWTH Performed at [facility]   Final   Report Status 07/21/2014 FINAL  Final  MRSA PCR Screening     Status: None   Collection Time: 07/21/14 12:59 AM  Result Value Ref Range Status   MRSA by PCR NEGATIVE NEGATIVE Final    Comment:        The GeneXpert MRSA Assay (FDA approved for NASAL specimens only), is one component of a comprehensive MRSA colonization surveillance program. It is not intended to diagnose MRSA infection nor to guide or monitor treatment for MRSA infections.   Wound culture     Status: None   Collection Time: 07/21/14  1:17 PM  Result Value Ref Range Status   Specimen Description SACRAL  Final   Special Requests Normal  Final   Gram Stain   Final    FEW WBC PRESENT,BOTH PMN AND MONONUCLEAR NO SQUAMOUS EPITHELIAL CELLS SEEN MODERATE GRAM POSITIVE COCCI IN PAIRS IN CLUSTERS Performed at [facility]    Culture   Final    ABUNDANT METHICILLIN RESISTANT STAPHYLOCOCCUS AUREUS Note: RIFAMPIN AND GENTAMICIN SHOULD NOT BE USED AS SINGLE DRUGS FOR TREATMENT OF STAPH INFECTIONS. This organism DOES NOT demonstrate inducible Clindamycin resistance in vitro. CRITICAL RESULT CALLED TO, READ BACK BY AND VERIFIED WITH: KIM C 2/7  BY  REAMM Performed at [facility]    Report Status 07/24/2014 FINAL  Final   Organism ID, Bacteria METHICILLIN  RESISTANT STAPHYLOCOCCUS AUREUS  Final      Susceptibility   Methicillin resistant staphylococcus aureus - MIC*    CLINDAMYCIN <=0.25 SENSITIVE Sensitive     ERYTHROMYCIN >=8 RESISTANT Resistant     GENTAMICIN <=0.5 SENSITIVE Sensitive     LEVOFLOXACIN >=8 RESISTANT Resistant     OXACILLIN >=4 RESISTANT Resistant     PENICILLIN >=0.5 RESISTANT Resistant     RIFAMPIN <=0.5 SENSITIVE Sensitive     TRIMETH/SULFA <=10 SENSITIVE Sensitive     VANCOMYCIN <=0.5 SENSITIVE Sensitive     TETRACYCLINE <=1 SENSITIVE Sensitive     * ABUNDANT METHICILLIN RESISTANT STAPHYLOCOCCUS AUREUS  Anaerobic culture     Status: None (Preliminary result)   Collection Time: 07/26/14 10:38 AM  Result Value Ref Range Status   Specimen Description WOUND SACRAL DECUBITIS  Final   Special Requests NONE  Final   Gram Stain   Final    MODERATE WBC PRESENT, PREDOMINANTLY PMN NO SQUAMOUS EPITHELIAL CELLS SEEN RARE GRAM POSITIVE COCCI IN PAIRS Performed at [facility]    Culture   Final    NO ANAEROBES ISOLATED; CULTURE IN PROGRESS FOR 5 DAYS Performed at [facility]    Report Status PENDING  Incomplete  Wound culture     Status: None (Preliminary result)   Collection Time: 07/26/14 10:38 AM  Result Value Ref Range Status   Specimen Description WOUND SACRAL DECUBITIS  Final   Special Requests NONE  Final   Gram Stain   Final    ABUNDANT WBC PRESENT,BOTH PMN AND MONONUCLEAR NO SQUAMOUS  EPITHELIAL CELLS SEEN FEW GRAM POSITIVE COCCI IN PAIRS IN CLUSTERS [facility]    Culture   Final    Culture reincubated for better growth [facility]formed at [facility]    Report Status PENDING  Incomplete           Studies: No results found.      Scheduled Meds: . aspirin  325 mg Oral Daily  . divalproex  125 mg Oral BID  . donepezil  10 mg Oral QHS  . feeding supplement (ENSURE COMPLETE)  237 mL Oral BID BM  . feeding supplement (PRO-STAT SUGAR FREE 64)  30 mL  Oral TID WC  . memantine  10 mg Oral BID  . multivitamin with minerals  1 tablet Oral Daily  . sodium chloride  3 mL Intravenous Q12H  . vancomycin  1,250 mg Intravenous Q24H   Continuous Infusions:    Time spent: 35 minutes.   Calvert CantorIZWAN,Jazzalynn Rhudy, MD [facility] Pager: [email or web address] Password [email or web address] 07/27/2014, 2:01 PM    LOS: 7 days

## 2014-07-27 NOTE — Progress Notes (Signed)
1 Day Post-Op  Subjective: Dressing changed.  Objective: Vital signs in last 24 hours: Temp:  [97.6 F (36.4 C)-99.2 F (37.3 C)] 97.6 F (36.4 C) (02/10 0523) Pulse Rate:  [7-89] 69 (02/10 0523) Resp:  [16-25] 18 (02/10 0523) BP: (132-163)/(65-72) 142/72 mmHg (02/10 0523) SpO2:  [99 %-100 %] 99 % (02/10 0523) Last BM Date: 07/26/14 240 PO  DIII diet +BM x 1 Afebrile, VSS WBC 16.0 Intake/Output from previous day: 02/09 0701 - 02/10 0700 In: 790 [P.O.:240; I.V.:550] Out: 1550 [Urine:1550] Intake/Output this shift:    General appearance: alert and cooperative Skin: open debrided site clean, we had to clean stool before redoing dressing.  No necrotic tissue left in bed of decubitus.  Lab Results:   Recent Labs  07/26/14 0535 07/27/14 0935  WBC 16.1* 16.0*  HGB 13.2 14.0  HCT 40.5 42.5  PLT 270 257    BMET  Recent Labs  07/25/14 0520  NA 137  K 3.6  CL 102  CO2 27  GLUCOSE 89  BUN 25*  CREATININE 1.21  CALCIUM 8.5   PT/INR No results for input(s): LABPROT, INR in the last 72 hours.   Recent Labs Lab 07/20/14 1713 07/21/14 0530  AST 73* 54*  ALT 32 30  ALKPHOS 101 84  BILITOT 1.1 0.9  PROT 8.0 6.6  ALBUMIN 3.4* 2.7*     Lipase  No results found for: LIPASE   Studies/Results: No results found.  Medications: . aspirin  325 mg Oral Daily  . divalproex  125 mg Oral BID  . donepezil  10 mg Oral QHS  . feeding supplement (ENSURE COMPLETE)  237 mL Oral BID BM  . feeding supplement (PRO-STAT SUGAR FREE 64)  30 mL Oral TID WC  . memantine  10 mg Oral BID  . multivitamin with minerals  1 tablet Oral Daily  . sodium chloride  3 mL Intravenous Q12H  . vancomycin  1,250 mg Intravenous Q24H    Assessment/Plan Sepsis Infected necrotic decubitus ulcer S/p I & D of sacral decubitus 07/26/14 Day 6 of Vancomycin Chronic AF Stage III chronic renal disease Advanced Alzheimer's disease Bilateral hydronephrosis SCD for DVT prophylaxis   Plan:  Wet  to dry dressing BID, and PRN when solied.  Maximize nutrition, get him up and moving if possible.  Keep him off his sacrum as much as possible.    LOS: 7 days    Darryl Summers 07/27/2014

## 2014-07-27 NOTE — Progress Notes (Signed)
Clinical Social Work  CSW sent updated PT/OT notes for SNF authorization. CSW will continue to follow.  [place]olly Helm[place] Summers, [place] [phone]

## 2014-07-27 NOTE — Progress Notes (Signed)
F/c removed. Pt tolerated well. Denies any pain at this time.

## 2014-07-27 NOTE — Progress Notes (Signed)
Occupational Therapy Treatment Patient Details Name: Darryl Summers MRN: [id number] DOB: 01/24/1925 Today's Date: 07/27/2014    History of present illness Darryl Summers is a 81 y.o. male adm with sacral decubitus, sepsis;  I & D performed 07/21/14.   past medical history:Alzheimer's dementia, which appears to be moderate to advanced, atrial fibrillation not on anticoagulation, hypertension; pt resides in memory unit., was ambulatory at baseline. has had I and D x 2.   OT comments  Pt participated well in OT session focusing on UB adls.  Needs cues for attention to task and  thoroughness  Follow Up Recommendations  SNF    Equipment Recommendations  None recommended by OT (unless pt able to SPT to 3:1)    Recommendations for Other Services      Precautions / Restrictions Precautions Precautions: Fall Precaution Comments: has wound on sacrum       Mobility Bed Mobility             General bed mobility comments: not attempted with OT  Transfers                 General transfer comment: not tested    Balance                                   ADL Overall ADL's : Needs assistance/impaired     Grooming: Wash/dry hands;Wash/dry face;Set up;Sitting   Upper Body Bathing: Moderate assistance;Sitting       Upper Body Dressing : Maximal assistance;Sitting                     General ADL Comments: performed UB adls from bed level:  pt was unable to sit at EOB with PT earlier.  repeatedly asked same questions but did attend to task better this session.  He needed min cues for grooming and mod for bathing.  Able to sustain attention for about 20 seconds without redirection      Vision                     Perception     Praxis      Cognition   Behavior During Therapy: [facility] for tasks assessed/performed Overall Cognitive Status: History of cognitive impairments - at baseline       Memory: Decreased short-term memory  Following Commands: Follows one step commands consistently            Extremity/Trunk Assessment               Exercises     Shoulder Instructions       General Comments      Pertinent Vitals/ Pain       Pain Assessment: Faces Faces Pain Scale: No hurt Pain Location: Patient c/o severe pain with attempts to sit up Pain Descriptors / Indicators: Crying;Grimacing;Guarding Pain Intervention(s): Patient requesting pain meds-RN notified;Limited activity within patient's tolerance  Home Living                                          Prior Functioning/Environment              Frequency Min 2X/week     Progress Toward Goals  OT Goals(current goals can now be found in the care plan section)  Progress towards OT  goals: Progressing toward goals     Plan Discharge plan remains appropriate    Co-evaluation                 End of Session     Activity Tolerance Patient tolerated treatment well   Patient Left in bed;with call bell/phone within reach;with bed alarm set   Nurse Communication          Time: [id number] OT Time Calculation (min): 15 min  Charges: OT General Charges $OT Visit: 1 Procedure OT Treatments $Self Care/Home Management : 8-22 mins  Darryl Summers 07/27/2014, 12:07 PM  Marica OtterMaryellen Julena Barbour, OTR/L [phone] 07/27/2014

## 2014-07-27 NOTE — Progress Notes (Signed)
Speech Language Pathology Treatment: Dysphagia  Patient Details Name: Darryl MaisRyland Kester Summers MRN: [id number] DOB: 03/14/1925 Today's Date: 07/27/2014 Time: [id number] SLP Time Calculation (min) (ACUTE ONLY): 16 min  Assessment / Plan / Recommendation Clinical Impression  Pt observed taking pills with applesauce given by RN.  He masticates pills thoroughly before swallowing and reports "it's fine" when instructed to try to swallow whole.  Use of orange juice x4 swallows *thickened* to aid clearance provided without immediate symptoms of dysphagia.  Pt accepted single bolus of thin water via cup - no overt s/s of aspiration or airway compromise.  He declined all further intake.    Pt continued to demonstrate delayed throat clearing after HOB lowered for comfort due to decub- for approximately 10 minutes throat clearing noted x3.  He reports "this happens all the time".    Positioning difficult for patient due to his sacral decub.  Recommend consider advancing diet to dys3/thin as pt's CXR was negative upon admit and he doesn't appear better with nectar than thin liquid at this time.    HPI HPI: 81 y.o. male with a past medical history of Alzheimer's dementia, which appears to be moderate to advanced, atrial fibrillation not on anticoagulation, hypertension, who lives in a skilled nursing facility in their memory unit.  Admitted with sepsis due to infected sacral decubitus.    Pertinent Vitals Pain Assessment: No/denies pain  SLP Plan  Continue with current plan of care    Recommendations Diet recommendations: Dysphagia 3 (mechanical soft);Nectar-thick liquid (consider advancing to thin due to improved mental status) Liquids provided via: Cup Medication Administration: Whole meds with puree Supervision: Intermittent supervision to cue for compensatory strategies Compensations: Slow rate;Small sips/bites Postural Changes and/or Swallow Maneuvers: Seated upright 90 degrees;Upright 30-60 min after  meal              Oral Care Recommendations: Oral care BID Follow up Recommendations: Skilled Nursing facility Plan: Continue with current plan of care    GO     Donavan Burnetamara Darlisa Spruiell, MS [facility] SLP [phone]

## 2014-07-27 NOTE — Progress Notes (Signed)
ANTIBIOTIC CONSULT NOTE - FOLLOW UP  Pharmacy Consult for vancomycin Indication: MRSA wound infection  No Known Allergies  Patient Measurements: Height:  (177.8 cm) Weight: 175 lb (79.379 kg) IBW/kg (Calculated) : 73 Adjusted Body Weight:   Vital Signs: Temp: 99.2 F (37.3 C) (02/09 2109) Temp Source: Oral (02/09 2109) BP: 146/70 mmHg (02/09 2109) Pulse Rate: 89 (02/09 2109) Intake/Output from previous day: 02/09 0701 - 02/10 0700 In: 790 [P.O.:240; I.V.:550] Out: 1100 [Urine:1100] Intake/Output from this shift: Total I/O In: 240 [P.O.:240] Out: 300 [Urine:300]  Labs:  Recent Labs  07/24/14 0557 07/25/14 0520 07/26/14 0535  WBC  --  16.3* 16.1*  HGB  --  13.5 13.2  PLT  --  216 270  CREATININE 1.28 1.21  --    Estimated Creatinine Clearance: 44.4 mL/min (by C-G formula based on Cr of 1.21).  Recent Labs  07/26/14 2102  VANCOTROUGH 21.9*     Microbiology: Recent Results (from the past 720 hour(s))  Blood Culture (routine x 2)     Status: None   Collection Time: 07/20/14  5:12 PM  Result Value Ref Range Status   Specimen Description BLOOD BLOOD LEFT FOREARM  Final   Special Requests BOTTLES DRAWN AEROBIC AND ANAEROBIC 5 CC EA  Final   Culture   Final    NO GROWTH 5 DAYS Performed at [facility]    Report Status 07/26/2014 FINAL  Final  Blood Culture (routine x 2)     Status: None   Collection Time: 07/20/14  5:20 PM  Result Value Ref Range Status   Specimen Description BLOOD LEFT ANTECUBITAL  Final   Special Requests BOTTLES DRAWN AEROBIC AND ANAEROBIC 5CC EACH  Final   Culture   Final    NO GROWTH 5 DAYS Performed at [facility]    Report Status 07/26/2014 FINAL  Final  Urine culture     Status: None   Collection Time: 07/20/14  6:16 PM  Result Value Ref Range Status   Specimen Description URINE, CATHETERIZED  Final   Special Requests NONE  Final   Colony Count NO GROWTH Performed at [facility]   Final    Culture NO GROWTH Performed at [facility]   Final   Report Status 07/21/2014 FINAL  Final  MRSA PCR Screening     Status: None   Collection Time: 07/21/14 12:59 AM  Result Value Ref Range Status   MRSA by PCR NEGATIVE NEGATIVE Final    Comment:        The GeneXpert MRSA Assay (FDA approved for NASAL specimens only), is one component of a comprehensive MRSA colonization surveillance program. It is not intended to diagnose MRSA infection nor to guide or monitor treatment for MRSA infections.   Wound culture     Status: None   Collection Time: 07/21/14  1:17 PM  Result Value Ref Range Status   Specimen Description SACRAL  Final   Special Requests Normal  Final   Gram Stain   Final    FEW WBC PRESENT,BOTH PMN AND MONONUCLEAR NO SQUAMOUS EPITHELIAL CELLS SEEN MODERATE GRAM POSITIVE COCCI IN PAIRS IN CLUSTERS Performed at [facility]    Culture   Final    ABUNDANT METHICILLIN RESISTANT STAPHYLOCOCCUS AUREUS Note: RIFAMPIN AND GENTAMICIN SHOULD NOT BE USED AS SINGLE DRUGS FOR TREATMENT OF STAPH INFECTIONS. This organism DOES NOT demonstrate inducible Clindamycin resistance in vitro. CRITICAL RESULT CALLED TO, READ BACK BY AND VERIFIED WITH: KIM C 2/7 @  830 BY  REAMM [facility]formed at [facility]    Report Status 07/24/2014 FINAL  Final   Organism ID, Bacteria METHICILLIN RESISTANT STAPHYLOCOCCUS AUREUS  Final      Susceptibility   Methicillin resistant staphylococcus aureus - MIC*    CLINDAMYCIN <=0.25 SENSITIVE Sensitive     ERYTHROMYCIN >=8 RESISTANT Resistant     GENTAMICIN <=0.5 SENSITIVE Sensitive     LEVOFLOXACIN >=8 RESISTANT Resistant     OXACILLIN >=4 RESISTANT Resistant     PENICILLIN >=0.5 RESISTANT Resistant     RIFAMPIN <=0.5 SENSITIVE Sensitive     TRIMETH/SULFA <=10 SENSITIVE Sensitive     VANCOMYCIN <=0.5 SENSITIVE Sensitive     TETRACYCLINE <=1 SENSITIVE Sensitive     * ABUNDANT METHICILLIN RESISTANT STAPHYLOCOCCUS AUREUS     Anti-infectives    Start     Dose/Rate Route Frequency Ordered Stop   07/27/14 2000  vancomycin (VANCOCIN) 1,250 mg in sodium chloride 0.9 % 250 mL IVPB     1,250 mg 166.7 mL/hr over 90 Minutes Intravenous Every 24 hours 07/27/14 0424     07/24/14 2200  vancomycin (VANCOCIN) IVPB 750 mg/150 ml premix  Status:  Discontinued     750 mg 150 mL/hr over 60 Minutes Intravenous Every 12 hours 07/24/14 1054 07/27/14 0425   07/23/14 1800  vancomycin (VANCOCIN) IVPB 1000 mg/200 mL premix  Status:  Discontinued     1,000 mg 200 mL/hr over 60 Minutes Intravenous Every 48 hours 07/21/14 1247 07/23/14 0820   07/23/14 1100  piperacillin-tazobactam (ZOSYN) IVPB 3.375 g  Status:  Discontinued     3.375 g 12.5 mL/hr over 240 Minutes Intravenous Every 8 hours 07/23/14 0817 07/24/14 1019   07/23/14 0830  vancomycin (VANCOCIN) IVPB 1000 mg/200 mL premix  Status:  Discontinued     1,000 mg 200 mL/hr over 60 Minutes Intravenous Every 24 hours 07/23/14 0820 07/24/14 1054   07/21/14 2200  vancomycin (VANCOCIN) IVPB 750 mg/150 ml premix  Status:  Discontinued     750 mg 150 mL/hr over 60 Minutes Intravenous Every 24 hours 07/21/14 0515 07/21/14 1247   07/21/14 1800  vancomycin (VANCOCIN) IVPB 750 mg/150 ml premix     750 mg 150 mL/hr over 60 Minutes Intravenous  Once 07/21/14 1247 07/21/14 1822   07/21/14 1400  piperacillin-tazobactam (ZOSYN) IVPB 2.25 g  Status:  Discontinued     2.25 g 100 mL/hr over 30 Minutes Intravenous 4 times per day 07/21/14 1247 07/23/14 0817   07/21/14 0100  piperacillin-tazobactam (ZOSYN) IVPB 3.375 g  Status:  Discontinued     3.375 g 12.5 mL/hr over 240 Minutes Intravenous 3 times per day 07/21/14 0056 07/21/14 1247   07/20/14 2030  vancomycin (VANCOCIN) IVPB 1000 mg/200 mL premix     1,000 mg 200 mL/hr over 60 Minutes Intravenous  Once 07/20/14 2026 07/20/14 2203   07/20/14 1830  cefTRIAXone (ROCEPHIN) 1 g in dextrose 5 % 50 mL IVPB     1 g 100 mL/hr over 30 Minutes  Intravenous  Once 07/20/14 1823 07/20/14 1931      Assessment: Patient with vancomycin level above goal.  Prior doses charted correctly.    Goal of Therapy:  Vancomycin trough level 15-20 mcg/ml  Plan:  Measure antibiotic drug levels at steady state Follow up culture results Change vancomycin to 1250mg  iv q24hr, next dose at [address]2000  Vyolet Sakuma Jr, Jacquenette ShoneJulian Crowford 07/27/2014,4:25 AM

## 2014-07-28 DIAGNOSIS — A4102 Sepsis due to Methicillin resistant Staphylococcus aureus: Principal | ICD-10-CM

## 2014-07-28 LAB — BASIC METABOLIC PANEL
Anion gap: 5 (ref 5–15)
BUN: 25 mg/dL — ABNORMAL HIGH (ref 6–23)
CALCIUM: 8.3 mg/dL — AB (ref 8.4–10.5)
CO2: 28 mmol/L (ref 19–32)
Chloride: 103 mmol/L (ref 96–112)
Creatinine, Ser: 1.34 mg/dL (ref 0.50–1.35)
GFR calc Af Amer: 53 mL/min — ABNORMAL LOW (ref >90)
GFR calc non Af Amer: 46 mL/min — ABNORMAL LOW (ref >90)
Glucose, Bld: 95 mg/dL (ref 70–99)
Potassium: 4.4 mmol/L (ref 3.5–5.1)
SODIUM: 136 mmol/L (ref 135–145)

## 2014-07-28 LAB — CBC
HCT: 41.7 % (ref 39.0–52.0)
HEMOGLOBIN: 13.4 g/dL (ref 13.0–17.0)
MCH: 31 pg (ref 26.0–34.0)
MCHC: 32.1 g/dL (ref 30.0–36.0)
MCV: 96.5 fL (ref 78.0–100.0)
Platelets: 294 10*3/uL (ref 150–400)
RBC: 4.32 MIL/uL (ref 4.22–5.81)
RDW: 14.5 % (ref 11.5–15.5)
WBC: 14.5 10*3/uL — ABNORMAL HIGH (ref 4.0–10.5)

## 2014-07-28 MED ORDER — DOXYCYCLINE HYCLATE 100 MG PO TABS
100.0000 mg | ORAL_TABLET | Freq: Two times a day (BID) | ORAL | Status: DC
  Administered 2014-07-28: 100 mg via ORAL
  Filled 2014-07-28 (×3): qty 1

## 2014-07-28 MED ORDER — DOXYCYCLINE HYCLATE 100 MG PO TABS: 100.0000 mg | ORAL_TABLET | Freq: Two times a day (BID) | ORAL | Status: DC

## 2014-07-28 MED ORDER — PRO-STAT SUGAR FREE PO LIQD: 30.0000 mL | Freq: Three times a day (TID) | ORAL | Status: AC

## 2014-07-28 NOTE — Progress Notes (Signed)
[facility] called for transport.     Crimson Beer, LCSW [facility] Clinical Social Worker cell #: [phone]  

## 2014-07-28 NOTE — Progress Notes (Signed)
Patient is set to [facility] today. Patient & son, Darryl CountsBob aware. Discharge packet given to RN, Kirstin. [facility] will be called for transport once son calls back.   Clinical Social Work Department CLINICAL SOCIAL WORK PLACEMENT NOTE 07/28/2014  Patient:  [name],Darryl KESTER  Account Number:  [id number] Admit date:  07/20/2014  Clinical Social Worker:  Unk LightningHOLLY GERBER, LCSW  Date/time:  07/22/2014 10:00 AM  Clinical Social Work is seeking post-discharge placement for this patient at the following level of care:   SKILLED NURSING   (*CSW will update this form in Epic as items are completed)   07/22/2014  Patient/family provided with [facility] Department of Clinical Social Work's list of facilities offering this level of care within the geographic area requested by the patient (or if unable, by the patient's family).  07/22/2014  Patient/family informed of their freedom to choose among providers that offer the needed level of care, that participate in Medicare, Medicaid or managed care program needed by the patient, have an available bed and are willing to accept the patient.  07/22/2014  Patient/family informed of [facility]' ownership interest in [facility], as well as of the fact that they are under no obligation to receive care at this facility.  PASARR submitted to EDS on 07/22/2014 PASARR number received on 07/22/2014  FL2 transmitted to all facilities in geographic area requested by pt/family on  07/22/2014 FL2 transmitted to all facilities within larger geographic area on   Patient informed that his/her managed care company has contracts with or will negotiate with  certain facilities, including the following:     Patient/family informed of bed offers received:  07/23/2014 [facility] [place] Physician recommends and patient chooses bed at    [facility]  [place] on  07/28/2014 Patient to be transferred to facility by [facility] Patient and family notified of transfer on 07/28/2014 Name of family member notified:  patient's son, Darryl CountsBob via phone  The following physician request were entered in Epic:   Additional Comments:   Darryl MaxinKelly Nalini Alcaraz, LCSW [facility] Clinical Social Worker cell #: [phone]

## 2014-07-28 NOTE — Progress Notes (Signed)
[facility] Burch[facility]Robert [facility]. Left number if he had additional questions.

## 2014-07-28 NOTE — Discharge Summary (Addendum)
Physician Discharge Summary  Darryl Summers WUJ:[id number] DOB: [birth year] DOA: 07/20/2014  PCP: No primary care provider on file.  Admit date: 07/20/2014 Discharge date: 07/28/2014  Time spent: 55 minutes  Recommendations for Outpatient Follow-up:  CBC and Bmet in 3 days No end date for antibiotics determined yet- please follow up wound carefully Patient will need a referral to the wound care center ASAP Encourage fluids Will need Air mattress at facility Keep condom cath on to prevent soiling of the sacral wound  Discharge Condition: stable Diet recommendation: low sodium diet  Discharge Diagnoses:  Principal Problem:   Sepsis due to methicillin resistant Staphylococcus aureus (MRSA) Active Problems:   Infected decubitus ulcer   Chronic a-fib   Dementia   Essential hypertension   History of present illness:   Sepsis, present on admission: Secondary to infected sacral decubitus ulcers. Treated with IV fluid hydration and antibiotics. Sepsis physiology have improved. Blood cultures 2: Negative to date. Urine culture negative.   Necrotic/Infected sacral decubitus ulcer/ MRSA: A CT scan performed on 07/20/2014 showed diffuse soft tissue inflammation around the anorectal canal which could be consistent with acute infection of sacral decubitus ulcer.  General surgery was consulted as he underwent bedside debridement on 07/21/2014. Wound cultures drawn on 07/21/2014 showing abundant Staphylococcus aureus with susceptibility testing showing MRSA. Blood cultures remain negative. S/p debridement in OR on 2/9 Dr Carolynne Edouard noted 2 deep tracks which he debrided. Now on air mattress. Has been on IV Vancomycin which was started on 2/3- WBC improved to 14 today- will transition to Doxy on d/c today based upon sensitivities. Added Prostat to aid wound healing.    Hypokalemia- adequately replaced  4. Chronic atrial fibrillation: Not on rate control medications PTA. Not on  anticoagulation despite high CHA2DS2- VASc score 5,  5. Acute on stage III chronic kidney disease: Likely secondary to sepsis, hypovolemia. Resolved  6. Advanced Alzheimer's disease: Pleasantly confused but no behavioral disturbances. Continue Aricept and Namenda. Mental status at baseline. Continue divalproex.   7. Essential hypertension: Controlled without home medications. Monitor.   8. Incidental cholelithiasis: Noted on CT. Patient asymptomatic  9. Mild bilateral hydronephrosis Seen on CT. Possibly from urinary retention? Related to BPH. D/c Foley catheter removed-  voiding will- placed condom cath to avoid soiling of necrotic/infected sacral wound.   Consultants:  General surgery  Procedures:  Bedside debridement of necrotic/infected sacral wound on 07/21/14  OR debridement on 2/9  Foley catheter- removed on 2/10  Antibiotics:  IV vancomycin 07/20/14 - 07/28/14  IV Zosyn 07/20/14 stopped on 07/24/2014  Discharge Exam: Filed Weights   07/20/14 1725  Weight: 79.379 kg (175 lb)   Filed Vitals:   07/28/14 0635  BP: 136/61  Pulse: 63  Temp: 98 F (36.7 C)  Resp: 18    General: AA but confused, no distress Cardiovascular: RRR, no murmurs  Respiratory: clear to auscultation bilaterally GI: soft, non-tender, non-distended, bowel sound positive  Discharge Instructions You were cared for by a hospitalist during your hospital stay. If you have any questions about your discharge medications or the care you received while you were in the hospital after you are discharged, you can call the unit and asked to speak with the hospitalist on call if the hospitalist that took care of you is not available. Once you are discharged, your primary care physician will handle any further medical issues. Please note that NO REFILLS for any discharge medications will  be authorized once you are discharged, as it is imperative  that you return to your primary care physician (or establish a relationship with a primary care physician if you do not have one) for your aftercare needs so that they can reassess your need for medications and monitor your lab values.     Medication List    ASK your doctor about these medications        acetaminophen 325 MG tablet  Commonly known as:  TYLENOL  Take 650 mg by mouth every 6 (six) hours as needed for mild pain.     aspirin 325 MG tablet  Take 325 mg by mouth daily.     divalproex 125 MG DR tablet  Commonly known as:  DEPAKOTE  Take 125 mg by mouth 2 (two) times daily.     donepezil 10 MG tablet  Commonly known as:  ARICEPT  Take 10 mg by mouth at bedtime.     feeding supplement Liqd  Take 237 mLs by mouth daily.     loperamide 2 MG capsule  Commonly known as:  IMODIUM  Take 2-4 mg by mouth as needed for diarrhea or loose stools.     LORazepam 0.5 MG tablet  Commonly known as:  ATIVAN  Take 0.5 mg by mouth every 8 (eight) hours as needed for anxiety or sleep.     memantine 10 MG tablet  Commonly known as:  NAMENDA  Take 10 mg by mouth 2 (two) times daily.     NYSTATIN EX  Apply 1 application topically 3 (three) times daily. Apply to buttocks until healed       No Known Allergies    The results of significant diagnostics from this hospitalization (including imaging, microbiology, ancillary and laboratory) are listed below for reference.    Significant Diagnostic Studies: Ct Abdomen Pelvis Wo Contrast  07/20/2014   CLINICAL DATA:  Sacral decubitus ulceration. Worsening generalized weakness and incontinence. Initial encounter.  EXAM: CT ABDOMEN AND PELVIS WITHOUT CONTRAST  TECHNIQUE: Multidetector CT imaging of the abdomen and pelvis was performed following the standard protocol without IV contrast.  COMPARISON:  CT of the abdomen and pelvis performed 05/24/2002, and renal ultrasound performed  07/06/2007  FINDINGS: Honeycombing and scarring are noted at the visualized lung bases.  A linear 3.5 cm hypodensity within the inferior right hepatic lobe may reflect remote injury or a cyst, increased in size from 2003. Previously noted hepatic cysts appear to have resolved. The spleen is unremarkable. Stones are noted dependently within the gallbladder. The gallbladder is otherwise unremarkable. The pancreas and adrenal glands are unremarkable.  Mild bilateral hydronephrosis is noted. This is thought to reflect marked distention of the bladder, without evidence of a distal obstructing stone. Multiple large right renal cysts are seen, measuring up to 9.1 cm in size. Nonspecific perinephric stranding is noted bilaterally. No nonobstructing renal stones are identified.  No free fluid is identified. The small bowel is unremarkable in appearance. The stomach is within normal limits. No acute vascular abnormalities are seen. Scattered calcification is noted along the abdominal aorta and its branches.  The appendix is normal in caliber, without evidence for appendicitis. Scattered diverticulosis is noted along the proximal sigmoid colon, and a single diverticulum is noted at the proximal descending colon, without evidence of diverticulitis.  The bladder is significantly distended. The prostate is mildly enlarged, measuring 5.1 cm in transverse dimension, with scattered calcification. No inguinal lymphadenopathy is seen.  There is diffuse soft tissue inflammation about the anorectal canal, slightly more prominent on the right, with a likely  associated ulceration along the overlying right-sided perineal soft tissues. Minimal air is noted within the right-sided soft tissues, likely reflect an underlying sinus tract, without a defined abscess. There is mild wall thickening along the distal rectum.  No acute osseous abnormalities are identified. Multilevel vacuum phenomenon is noted at the lower lumbar spine.  IMPRESSION: 1.  Diffuse soft tissue inflammation around the anorectal canal, slightly more prominent on the right, with a likely associated ulceration along the overlying right-sided perineal soft tissues. Minimal air within the right-sided soft tissues likely reflects an underlying sinus tract, without a defined abscess. No evidence of involvement of osseous structures at this time. Mild wall thickening noted along the distal rectum. 2. Mild bilateral hydronephrosis is thought to reflect marked distention of the bladder. No evidence of distal obstructing stone. Multiple large right renal cysts seen. 3. Linear 3.5 cm hypodensity within the right hepatic lobe may reflect remote injury or a cyst. 4. Cholelithiasis; gallbladder otherwise unremarkable in appearance. 5. Scattered diverticulosis along the proximal sigmoid colon, without evidence of diverticulitis. 6. Scattered calcification along the abdominal aorta and its branches.   Electronically Signed   By: Roanna Raider M.D.   On: 07/20/2014 21:06   Ct Head Wo Contrast  07/20/2014   CLINICAL DATA:  Altered mental status.  Dementia  EXAM: CT HEAD WITHOUT CONTRAST  TECHNIQUE: Contiguous axial images were obtained from the base of the skull through the vertex without intravenous contrast.  COMPARISON:  May 12, 2014  FINDINGS: Moderate diffuse atrophy is stable. There is no intracranial mass, hemorrhage, extra-axial fluid collection, or midline shift. 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 There is evidence of a prior infarct at the superior temporal -occipital junction on the right, stable. There is extensive small vessel disease throughout the centra semiovale bilaterally. There is small vessel disease throughout both external capsules. There is no new gray-white compartment lesion. No acute infarct apparent. Bony calvarium appears intact. The mastoid air  cells are clear.  IMPRESSION: Atrophy with prior infarcts on the right as well as widespread small vessel disease. No intracranial mass, hemorrhage, or acute appearing infarct.   Electronically Signed   By: Bretta Bang M.D.   On: 07/20/2014 19:19   Dg Chest Port 1 View  07/20/2014   CLINICAL DATA:  Weakness and altered mental status  EXAM: PORTABLE CHEST - 1 VIEW  COMPARISON:  None.  FINDINGS: There is no edema or consolidation. There is fibrotic type change in the periphery of the lungs bilaterally. Heart is mildly enlarged with pulmonary vascularity within normal limits. There is atherosclerotic change in a. No adenopathy. No bone lesions.  IMPRESSION: Peripheral fibrosis in the lungs bilaterally. No frank edema or consolidation. Mild cardiomegaly.   Electronically Signed   By: Bretta Bang M.D.   On: 07/20/2014 17:45    Microbiology: Recent Results (from the past 240 hour(s))  Blood Culture (routine x 2)     Status: None   Collection Time: 07/20/14  5:12 PM  Result Value Ref Range Status   Specimen Description BLOOD BLOOD LEFT FOREARM  Final   Special Requests BOTTLES DRAWN AEROBIC AND ANAEROBIC 5 CC EA  Final   Culture   Final    NO GROWTH 5 DAYS Performed at [facility]    Report Status 07/26/2014 FINAL  Final  Blood Culture (routine x 2)  Status: None   Collection Time: 07/20/14  5:20 PM  Result Value Ref Range Status   Specimen Description BLOOD LEFT ANTECUBITAL  Final   Special Requests BOTTLES DRAWN AEROBIC AND ANAEROBIC 5CC EACH  Final   Culture   Final    NO GROWTH 5 DAYS Performed at [facility]    Report Status 07/26/2014 FINAL  Final  Urine culture     Status: None   Collection Time: 07/20/14  6:16 PM  Result Value Ref Range Status   Specimen Description URINE, CATHETERIZED  Final   Special Requests NONE  Final   Colony Count NO GROWTH Performed at [facility]   Final   Culture NO GROWTH Performed at [facility]    Final   Report Status 07/21/2014 FINAL  Final  MRSA PCR Screening     Status: None   Collection Time: 07/21/14 12:59 AM  Result Value Ref Range Status   MRSA by PCR NEGATIVE NEGATIVE Final    Comment:        The GeneXpert MRSA Assay (FDA approved for NASAL specimens only), is one component of a comprehensive MRSA colonization surveillance program. It is not intended to diagnose MRSA infection nor to guide or monitor treatment for MRSA infections.   Wound culture     Status: None   Collection Time: 07/21/14  1:17 PM  Result Value Ref Range Status   Specimen Description SACRAL  Final   Special Requests Normal  Final   Gram Stain   Final    FEW WBC PRESENT,BOTH PMN AND MONONUCLEAR NO SQUAMOUS EPITHELIAL CELLS SEEN MODERATE GRAM POSITIVE COCCI IN PAIRS IN CLUSTERS Performed at [facility]    Culture   Final    ABUNDANT METHICILLIN RESISTANT STAPHYLOCOCCUS AUREUS Note: RIFAMPIN AND GENTAMICIN SHOULD NOT BE USED AS SINGLE DRUGS FOR TREATMENT OF STAPH INFECTIONS. This organism DOES NOT demonstrate inducible Clindamycin resistance in vitro. CRITICAL RESULT CALLED TO, READ BACK BY AND VERIFIED WITH: KIM C 2/7  BY  REAMM Performed at [facility]    Report Status 07/24/2014 FINAL  Final   Organism ID, Bacteria METHICILLIN RESISTANT STAPHYLOCOCCUS AUREUS  Final      Susceptibility   Methicillin resistant staphylococcus aureus - MIC*    CLINDAMYCIN <=0.25 SENSITIVE Sensitive     ERYTHROMYCIN >=8 RESISTANT Resistant     GENTAMICIN <=0.5 SENSITIVE Sensitive     LEVOFLOXACIN >=8 RESISTANT Resistant     OXACILLIN >=4 RESISTANT Resistant     PENICILLIN >=0.5 RESISTANT Resistant     RIFAMPIN <=0.5 SENSITIVE Sensitive     TRIMETH/SULFA <=10 SENSITIVE Sensitive     VANCOMYCIN <=0.5 SENSITIVE Sensitive     TETRACYCLINE <=1 SENSITIVE Sensitive     * ABUNDANT METHICILLIN RESISTANT STAPHYLOCOCCUS AUREUS  Anaerobic culture     Status: None (Preliminary result)    Collection Time: 07/26/14 10:38 AM  Result Value Ref Range Status   Specimen Description WOUND SACRAL DECUBITIS  Final   Special Requests NONE  Final   Gram Stain   Final    MODERATE WBC PRESENT, PREDOMINANTLY PMN NO SQUAMOUS EPITHELIAL CELLS SEEN RARE GRAM POSITIVE COCCI IN PAIRS Performed at [facility]    Culture   Final    NO ANAEROBES ISOLATED; CULTURE IN PROGRESS FOR 5 DAYS Performed at [facility]    Report Status PENDING  Incomplete  Wound culture     Status: None (Preliminary result)   Collection Time: 07/26/14 10:38 AM  Result  Value Ref Range Status   Specimen Description WOUND SACRAL DECUBITIS  Final   Special Requests NONE  Final   Gram Stain   Final    ABUNDANT WBC PRESENT,BOTH PMN AND MONONUCLEAR NO SQUAMOUS EPITHELIAL CELLS SEEN FEW GRAM POSITIVE COCCI IN PAIRS IN CLUSTERS Performed at [facility]    Culture   Final    ABUNDANT STAPHYLOCOCCUS AUREUS Note: RIFAMPIN AND GENTAMICIN SHOULD NOT BE USED AS SINGLE DRUGS FOR TREATMENT OF STAPH INFECTIONS. Performed at [facility]    Report Status PENDING  Incomplete     Labs: Basic Metabolic Panel:  Recent Labs Lab 07/23/14 0526 07/24/14 0557 07/25/14 0520 07/27/14 0935 07/28/14 0804  NA 138 139 137 138 136  K 2.9* 3.5 3.6 3.8 4.4  CL 105 104 102 102 103  CO2 27 26 27 27 28   GLUCOSE 148* 100* 89 105* 95  BUN 39* 27* 25* 23 25*  CREATININE 1.52* 1.28 1.21 1.23 1.34  CALCIUM 8.5 8.7 8.5 8.4 8.3*   Liver Function Tests: No results for input(s): AST, ALT, ALKPHOS, BILITOT, PROT, ALBUMIN in the last 168 hours. No results for input(s): LIPASE, AMYLASE in the last 168 hours. No results for input(s): AMMONIA in the last 168 hours. CBC:  Recent Labs Lab 07/23/14 0526 07/25/14 0520 07/26/14 0535 07/27/14 0935 07/28/14 0804  WBC 18.6* 16.3* 16.1* 16.0* 14.5*  HGB 13.9 13.5 13.2 14.0 13.4  HCT 42.4 41.9 40.5 42.5 41.7  MCV 94.4 94.8 95.7 95.3 96.5  PLT 217 216 270  257 294   Cardiac Enzymes: No results for input(s): CKTOTAL, CKMB, CKMBINDEX, TROPONINI in the last 168 hours. BNP: BNP (last 3 results) No results for input(s): BNP in the last 8760 hours.  ProBNP (last 3 results) No results for input(s): PROBNP in the last 8760 hours.  CBG: No results for input(s): GLUCAP in the last 168 hours.     SignedCalvert Cantor:  Laycee Fitzsimmons, MD [facility] 07/28/2014, 10:02 AM

## 2014-07-29 ENCOUNTER — Encounter (HOSPITAL_COMMUNITY): Payer: Self-pay

## 2014-07-29 ENCOUNTER — Inpatient Hospital Stay (HOSPITAL_COMMUNITY)
Admission: EM | Admit: 2014-07-29 | Discharge: 2014-07-31 | DRG: 682 | Disposition: A | Discharge status: Skilled Nursing Facility | Payer: Medicare HMO | Attending: Internal Medicine | Admitting: Internal Medicine

## 2014-07-29 ENCOUNTER — Emergency Department (HOSPITAL_COMMUNITY): Payer: Medicare HMO

## 2014-07-29 ENCOUNTER — Non-Acute Institutional Stay (SKILLED_NURSING_FACILITY): Payer: Medicare HMO | Admitting: Adult Health

## 2014-07-29 ENCOUNTER — Encounter: Payer: Self-pay | Admitting: Adult Health

## 2014-07-29 DIAGNOSIS — N183 Chronic kidney disease, stage 3 (moderate): Secondary | ICD-10-CM | POA: Diagnosis present

## 2014-07-29 DIAGNOSIS — I129 Hypertensive chronic kidney disease with stage 1 through stage 4 chronic kidney disease, or unspecified chronic kidney disease: Secondary | ICD-10-CM | POA: Diagnosis present

## 2014-07-29 DIAGNOSIS — I739 Peripheral vascular disease, unspecified: Secondary | ICD-10-CM | POA: Diagnosis present

## 2014-07-29 DIAGNOSIS — L089 Local infection of the skin and subcutaneous tissue, unspecified: Secondary | ICD-10-CM | POA: Diagnosis present

## 2014-07-29 DIAGNOSIS — F0280 Dementia in other diseases classified elsewhere without behavioral disturbance: Secondary | ICD-10-CM | POA: Diagnosis present

## 2014-07-29 DIAGNOSIS — I351 Nonrheumatic aortic (valve) insufficiency: Secondary | ICD-10-CM | POA: Diagnosis present

## 2014-07-29 DIAGNOSIS — E861 Hypovolemia: Secondary | ICD-10-CM | POA: Diagnosis present

## 2014-07-29 DIAGNOSIS — N179 Acute kidney failure, unspecified: Principal | ICD-10-CM | POA: Diagnosis present

## 2014-07-29 DIAGNOSIS — E43 Unspecified severe protein-calorie malnutrition: Secondary | ICD-10-CM | POA: Diagnosis present

## 2014-07-29 DIAGNOSIS — G309 Alzheimer's disease, unspecified: Secondary | ICD-10-CM | POA: Diagnosis present

## 2014-07-29 DIAGNOSIS — Z66 Do not resuscitate: Secondary | ICD-10-CM | POA: Diagnosis present

## 2014-07-29 DIAGNOSIS — D72829 Elevated white blood cell count, unspecified: Secondary | ICD-10-CM | POA: Diagnosis present

## 2014-07-29 DIAGNOSIS — L8994 Pressure ulcer of unspecified site, stage 4: Secondary | ICD-10-CM

## 2014-07-29 DIAGNOSIS — I482 Chronic atrial fibrillation, unspecified: Secondary | ICD-10-CM

## 2014-07-29 DIAGNOSIS — L8990 Pressure ulcer of unspecified site, unspecified stage: Secondary | ICD-10-CM

## 2014-07-29 DIAGNOSIS — F0390 Unspecified dementia without behavioral disturbance: Secondary | ICD-10-CM | POA: Diagnosis present

## 2014-07-29 DIAGNOSIS — K274 Chronic or unspecified peptic ulcer, site unspecified, with hemorrhage: Secondary | ICD-10-CM | POA: Diagnosis present

## 2014-07-29 DIAGNOSIS — Z8673 Personal history of transient ischemic attack (TIA), and cerebral infarction without residual deficits: Secondary | ICD-10-CM

## 2014-07-29 DIAGNOSIS — A4102 Sepsis due to Methicillin resistant Staphylococcus aureus: Secondary | ICD-10-CM

## 2014-07-29 DIAGNOSIS — Z79899 Other long term (current) drug therapy: Secondary | ICD-10-CM | POA: Diagnosis not present

## 2014-07-29 DIAGNOSIS — I10 Essential (primary) hypertension: Secondary | ICD-10-CM | POA: Diagnosis present

## 2014-07-29 DIAGNOSIS — Z7982 Long term (current) use of aspirin: Secondary | ICD-10-CM | POA: Diagnosis not present

## 2014-07-29 DIAGNOSIS — B9562 Methicillin resistant Staphylococcus aureus infection as the cause of diseases classified elsewhere: Secondary | ICD-10-CM | POA: Diagnosis present

## 2014-07-29 DIAGNOSIS — R531 Weakness: Secondary | ICD-10-CM | POA: Diagnosis present

## 2014-07-29 DIAGNOSIS — N289 Disorder of kidney and ureter, unspecified: Secondary | ICD-10-CM

## 2014-07-29 DIAGNOSIS — R5383 Other fatigue: Secondary | ICD-10-CM

## 2014-07-29 DIAGNOSIS — L89159 Pressure ulcer of sacral region, unspecified stage: Secondary | ICD-10-CM | POA: Diagnosis present

## 2014-07-29 DIAGNOSIS — L89154 Pressure ulcer of sacral region, stage 4: Secondary | ICD-10-CM | POA: Diagnosis present

## 2014-07-29 DIAGNOSIS — K284 Chronic or unspecified gastrojejunal ulcer with hemorrhage: Secondary | ICD-10-CM | POA: Diagnosis present

## 2014-07-29 LAB — CBC WITH DIFFERENTIAL/PLATELET
BASOS ABS: 0.1 10*3/uL (ref 0.0–0.1)
BASOS PCT: 0 % (ref 0–1)
EOS ABS: 0.2 10*3/uL (ref 0.0–0.7)
Eosinophils Relative: 1 % (ref 0–5)
HCT: 40 % (ref 39.0–52.0)
HEMOGLOBIN: 13 g/dL (ref 13.0–17.0)
Lymphocytes Relative: 8 % — ABNORMAL LOW (ref 12–46)
Lymphs Abs: 1.2 10*3/uL (ref 0.7–4.0)
MCH: 31.3 pg (ref 26.0–34.0)
MCHC: 32.5 g/dL (ref 30.0–36.0)
MCV: 96.4 fL (ref 78.0–100.0)
MONO ABS: 0.9 10*3/uL (ref 0.1–1.0)
Monocytes Relative: 6 % (ref 3–12)
NEUTROS ABS: 13.1 10*3/uL — AB (ref 1.7–7.7)
Neutrophils Relative %: 85 % — ABNORMAL HIGH (ref 43–77)
Platelets: 310 10*3/uL (ref 150–400)
RBC: 4.15 MIL/uL — AB (ref 4.22–5.81)
RDW: 14.8 % (ref 11.5–15.5)
WBC: 15.3 10*3/uL — AB (ref 4.0–10.5)

## 2014-07-29 LAB — WOUND CULTURE

## 2014-07-29 LAB — URINALYSIS, ROUTINE W REFLEX MICROSCOPIC
BILIRUBIN URINE: NEGATIVE
Glucose, UA: NEGATIVE mg/dL
KETONES UR: NEGATIVE mg/dL
Nitrite: NEGATIVE
Protein, ur: 30 mg/dL — AB
Specific Gravity, Urine: 1.016 (ref 1.005–1.030)
Urobilinogen, UA: 0.2 mg/dL (ref 0.0–1.0)
pH: 5.5 (ref 5.0–8.0)

## 2014-07-29 LAB — PROTIME-INR
INR: 1.26 (ref 0.00–1.49)
Prothrombin Time: 15.9 seconds — ABNORMAL HIGH (ref 11.6–15.2)

## 2014-07-29 LAB — COMPREHENSIVE METABOLIC PANEL
ALK PHOS: 91 U/L (ref 39–117)
ALT: 27 U/L (ref 0–53)
ANION GAP: 6 (ref 5–15)
AST: 39 U/L — ABNORMAL HIGH (ref 0–37)
Albumin: 2.3 g/dL — ABNORMAL LOW (ref 3.5–5.2)
BILIRUBIN TOTAL: 0.7 mg/dL (ref 0.3–1.2)
BUN: 37 mg/dL — AB (ref 6–23)
CHLORIDE: 103 mmol/L (ref 96–112)
CO2: 27 mmol/L (ref 19–32)
Calcium: 8.3 mg/dL — ABNORMAL LOW (ref 8.4–10.5)
Creatinine, Ser: 1.94 mg/dL — ABNORMAL HIGH (ref 0.50–1.35)
GFR calc Af Amer: 34 mL/min — ABNORMAL LOW (ref >90)
GFR calc non Af Amer: 29 mL/min — ABNORMAL LOW (ref >90)
Glucose, Bld: 132 mg/dL — ABNORMAL HIGH (ref 70–99)
Potassium: 4 mmol/L (ref 3.5–5.1)
Sodium: 136 mmol/L (ref 135–145)
Total Protein: 6.9 g/dL (ref 6.0–8.3)

## 2014-07-29 LAB — URINE MICROSCOPIC-ADD ON

## 2014-07-29 LAB — VALPROIC ACID LEVEL: Valproic Acid Lvl: 15.8 ug/mL — ABNORMAL LOW (ref 50.0–100.0)

## 2014-07-29 LAB — I-STAT CG4 LACTIC ACID, ED: Lactic Acid, Venous: 1.29 mmol/L (ref 0.5–2.0)

## 2014-07-29 MED ORDER — LORAZEPAM 0.5 MG PO TABS
0.5000 mg | ORAL_TABLET | Freq: Three times a day (TID) | ORAL | Status: DC | PRN
  Administered 2014-07-29: 0.5 mg via ORAL
  Filled 2014-07-29: qty 1

## 2014-07-29 MED ORDER — DONEPEZIL HCL 10 MG PO TABS
10.0000 mg | ORAL_TABLET | Freq: Every day | ORAL | Status: DC
  Administered 2014-07-29 – 2014-07-30 (×2): 10 mg via ORAL
  Filled 2014-07-29 (×4): qty 1

## 2014-07-29 MED ORDER — DIVALPROEX SODIUM 125 MG PO DR TAB
125.0000 mg | DELAYED_RELEASE_TABLET | Freq: Two times a day (BID) | ORAL | Status: DC
  Administered 2014-07-29 – 2014-07-31 (×4): 125 mg via ORAL
  Filled 2014-07-29 (×6): qty 1

## 2014-07-29 MED ORDER — MORPHINE SULFATE 2 MG/ML IJ SOLN: 2.0000 mg | INTRAMUSCULAR | Status: DC | PRN

## 2014-07-29 MED ORDER — ONDANSETRON HCL 4 MG/2ML IJ SOLN: 4.0000 mg | Freq: Four times a day (QID) | INTRAMUSCULAR | Status: DC | PRN

## 2014-07-29 MED ORDER — SENNOSIDES-DOCUSATE SODIUM 8.6-50 MG PO TABS: 1.0000 | ORAL_TABLET | Freq: Every evening | ORAL | Status: DC | PRN

## 2014-07-29 MED ORDER — PIPERACILLIN-TAZOBACTAM 3.375 G IVPB 30 MIN
3.3750 g | Freq: Once | INTRAVENOUS | Status: AC
  Administered 2014-07-29: 3.375 g via INTRAVENOUS
  Filled 2014-07-29 (×2): qty 50

## 2014-07-29 MED ORDER — LOPERAMIDE HCL 2 MG PO CAPS: 2.0000 mg | ORAL_CAPSULE | ORAL | Status: DC | PRN

## 2014-07-29 MED ORDER — ZOLPIDEM TARTRATE 5 MG PO TABS: 5.0000 mg | ORAL_TABLET | Freq: Every evening | ORAL | Status: DC | PRN

## 2014-07-29 MED ORDER — PIPERACILLIN-TAZOBACTAM 3.375 G IVPB
3.3750 g | Freq: Three times a day (TID) | INTRAVENOUS | Status: DC
  Administered 2014-07-30 – 2014-07-31 (×4): 3.375 g via INTRAVENOUS
  Filled 2014-07-29 (×5): qty 50

## 2014-07-29 MED ORDER — HYDROCODONE-ACETAMINOPHEN 5-325 MG PO TABS
1.0000 | ORAL_TABLET | ORAL | Status: DC | PRN
  Administered 2014-07-31 (×2): 1 via ORAL
  Filled 2014-07-29 (×2): qty 1

## 2014-07-29 MED ORDER — ACETAMINOPHEN 325 MG PO TABS: 650.0000 mg | ORAL_TABLET | Freq: Four times a day (QID) | ORAL | Status: DC | PRN

## 2014-07-29 MED ORDER — VANCOMYCIN HCL 500 MG IV SOLR: 500.0000 mg | INTRAVENOUS | Status: DC

## 2014-07-29 MED ORDER — SILVER NITRATE-POT NITRATE 75-25 % EX MISC
10.0000 "application " | CUTANEOUS | Status: DC | PRN
  Filled 2014-07-29: qty 10

## 2014-07-29 MED ORDER — ONDANSETRON HCL 4 MG PO TABS: 4.0000 mg | ORAL_TABLET | Freq: Four times a day (QID) | ORAL | Status: DC | PRN

## 2014-07-29 MED ORDER — VANCOMYCIN HCL IN DEXTROSE 1-5 GM/200ML-% IV SOLN
1000.0000 mg | Freq: Once | INTRAVENOUS | Status: AC
  Administered 2014-07-29: 1000 mg via INTRAVENOUS
  Filled 2014-07-29: qty 200

## 2014-07-29 MED ORDER — MEMANTINE HCL 10 MG PO TABS
10.0000 mg | ORAL_TABLET | Freq: Two times a day (BID) | ORAL | Status: DC
  Administered 2014-07-29 – 2014-07-31 (×4): 10 mg via ORAL
  Filled 2014-07-29 (×6): qty 1

## 2014-07-29 MED ORDER — ACETAMINOPHEN 650 MG RE SUPP: 650.0000 mg | Freq: Four times a day (QID) | RECTAL | Status: DC | PRN

## 2014-07-29 MED ORDER — SILVER NITRATE-POT NITRATE 75-25 % EX MISC
1.0000 "application " | CUTANEOUS | Status: DC | PRN
  Filled 2014-07-29: qty 100

## 2014-07-29 MED ORDER — SODIUM CHLORIDE 0.9 % IV BOLUS (SEPSIS)
1000.0000 mL | Freq: Once | INTRAVENOUS | Status: AC
Start: 2014-07-29 — End: 2014-07-29
  Administered 2014-07-29: 1000 mL via INTRAVENOUS

## 2014-07-29 MED ORDER — PRO-STAT SUGAR FREE PO LIQD
30.0000 mL | Freq: Three times a day (TID) | ORAL | Status: DC
  Administered 2014-07-30 – 2014-07-31 (×5): 30 mL via ORAL
  Filled 2014-07-29 (×8): qty 30

## 2014-07-29 MED ORDER — SODIUM CHLORIDE 0.9 % IV SOLN
INTRAVENOUS | Status: DC
  Administered 2014-07-29 – 2014-07-30 (×2): via INTRAVENOUS

## 2014-07-29 NOTE — ED Provider Notes (Signed)
CSN: [id number]     Arrival date & time 07/29/14  1135 History   First MD Initiated Contact with Patient 07/29/14 1151     Chief Complaint  Patient presents with  . Skin Ulcer     (Consider location/radiation/quality/duration/timing/severity/associated sxs/prior Treatment) The history is provided by the patient and a relative.  Darryl Summers is an 81 y/o M with PMHx of Alzheimer's, atrial fibrillation on aspirin, stroke, aortic insufficiency, hypertension, pernicious anemia presenting to the ED with sacral decubitus ulcer bleeding. Patient recently discharged yesterday regarding the debridement of necrotic sacral decubitus ulcer on 07/26/2014. Patient continues to take doxycycline. As per daughter-in-law who accompanies patient, reported the patient was seen and assessed this morning by on-site physician at the rehabilitation home. Reported that wound continues to bleed patient reports pain. ROS limited secondary to dementia.  Level V Caveat  PCP none   Past Medical History  Diagnosis Date  . Atrial fibrillation   . History of stroke   . Hypertension   . Aortic insufficiency   . Moderate mitral regurgitation   . Stroke   . Pernicious anemia   . Peripheral vascular disease   . Peripheral neuropathy    Past Surgical History  Procedure Laterality Date  . Incision and drainage perirectal abscess N/A 07/26/2014    Procedure: debridement of sacral wound ;  Surgeon: Chevis Pretty III, MD;  Location: [facility];  Service: General;  Laterality: N/A;  prone   History reviewed. No pertinent family history. History  Substance Use Topics  . Smoking status: Never Smoker   . Smokeless tobacco: Never Used  . Alcohol Use: No    Review of Systems  Unable to perform ROS: Dementia      Allergies  Review of patient's allergies indicates no known allergies.  Home Medications   Prior to Admission medications   Medication Sig Start Date End Date Taking? Authorizing Provider  acetaminophen  (TYLENOL) 325 MG tablet Take 650 mg by mouth every 6 (six) hours as needed for mild pain.   Yes Historical Provider, MD  Amino Acids-Protein Hydrolys (FEEDING SUPPLEMENT, PRO-STAT SUGAR FREE 64,) LIQD Take 30 mLs by mouth 3 (three) times daily with meals. Patient taking differently: Take 30 mLs by mouth 2 (two) times daily.  07/28/14  Yes Calvert Cantor, MD  aspirin 325 MG tablet Take 325 mg by mouth daily.   Yes Historical Provider, MD  divalproex (DEPAKOTE) 125 MG DR tablet Take 125 mg by mouth 2 (two) times daily.   Yes Historical Provider, MD  donepezil (ARICEPT) 10 MG tablet Take 10 mg by mouth at bedtime.   Yes Historical Provider, MD  doxycycline (VIBRA-TABS) 100 MG tablet Take 1 tablet (100 mg total) by mouth 2 (two) times daily. 07/28/14  Yes Calvert Cantor, MD  feeding supplement (ENSURE IMMUNE HEALTH) LIQD Take 120 mLs by mouth 2 (two) times daily.    Yes Historical Provider, MD  loperamide (IMODIUM) 2 MG capsule Take 2-4 mg by mouth as needed for diarrhea or loose stools.   Yes Historical Provider, MD  LORazepam (ATIVAN) 0.5 MG tablet Take 0.5 mg by mouth every 8 (eight) hours as needed for anxiety or sleep.   Yes Historical Provider, MD  memantine (NAMENDA) 10 MG tablet Take 10 mg by mouth 2 (two) times daily.   Yes Historical Provider, MD  TUBERCULIN PPD ID Inject 1 each into the skin once.   Yes Historical Provider, MD   BP 113/47 mmHg  Pulse 75  Temp(Src) 97.8  F (36.6 C) (Oral)  Resp 16  SpO2 99% Physical Exam  Constitutional: He is oriented to person, place, and time. He appears well-developed and well-nourished. No distress.  HENT:  Head: Normocephalic and atraumatic.  Eyes: Conjunctivae and EOM are normal. Right eye exhibits no discharge. Left eye exhibits no discharge.  Neck: Normal range of motion. Neck supple.  Cardiovascular: Normal rate, regular rhythm and normal heart sounds.  Exam reveals no friction rub.   No murmur heard. Pulmonary/Chest: Effort normal and breath  sounds normal. No respiratory distress. He has no wheezes. He has no rales.  Musculoskeletal: Normal range of motion.  Neurological: He is alert and oriented to person, place, and time. No cranial nerve deficit. He exhibits normal muscle tone. Coordination normal.  Skin: Skin is warm and dry. No rash noted. He is not diaphoretic. No erythema.  Approximately half a dollar sized area of the debrided tissue with active bleeding. Gauze saturated with bright red blood. Granulation tissue noted. Negative signs of active pus drainage. Negative red streaks running down the leg. Tenderness upon palpation to site.  Psychiatric: He has a normal mood and affect. His behavior is normal. Thought content normal.  Nursing note and vitals reviewed.   ED Course  Procedures (including critical care time)  Results for orders placed or performed during the hospital encounter of 07/29/14  CBC with Differential/Platelet  Result Value Ref Range   WBC 15.3 (H) 4.0 - 10.5 K/uL   RBC 4.15 (L) 4.22 - 5.81 MIL/uL   Hemoglobin 13.0 13.0 - 17.0 g/dL   HCT 16.1 09.6 - 04.5 %   MCV 96.4 78.0 - 100.0 fL   MCH 31.3 26.0 - 34.0 pg   MCHC 32.5 30.0 - 36.0 g/dL   RDW 40.9 81.1 - 91.4 %   Platelets 310 150 - 400 K/uL   Neutrophils Relative % 85 (H) 43 - 77 %   Neutro Abs 13.1 (H) 1.7 - 7.7 K/uL   Lymphocytes Relative 8 (L) 12 - 46 %   Lymphs Abs 1.2 0.7 - 4.0 K/uL   Monocytes Relative 6 3 - 12 %   Monocytes Absolute 0.9 0.1 - 1.0 K/uL   Eosinophils Relative 1 0 - 5 %   Eosinophils Absolute 0.2 0.0 - 0.7 K/uL   Basophils Relative 0 0 - 1 %   Basophils Absolute 0.1 0.0 - 0.1 K/uL  Comprehensive metabolic panel  Result Value Ref Range   Sodium 136 135 - 145 mmol/L   Potassium 4.0 3.5 - 5.1 mmol/L   Chloride 103 96 - 112 mmol/L   CO2 27 19 - 32 mmol/L   Glucose, Bld 132 (H) 70 - 99 mg/dL   BUN 37 (H) 6 - 23 mg/dL   Creatinine, Ser 7.82 (H) 0.50 - 1.35 mg/dL   Calcium 8.3 (L) 8.4 - 10.5 mg/dL   Total Protein 6.9 6.0  - 8.3 g/dL   Albumin 2.3 (L) 3.5 - 5.2 g/dL   AST 39 (H) 0 - 37 U/L   ALT 27 0 - 53 U/L   Alkaline Phosphatase 91 39 - 117 U/L   Total Bilirubin 0.7 0.3 - 1.2 mg/dL   GFR calc non Af Amer 29 (L) >90 mL/min   GFR calc Af Amer 34 (L) >90 mL/min   Anion gap 6 5 - 15  Protime-INR  Result Value Ref Range   Prothrombin Time 15.9 (H) 11.6 - 15.2 seconds   INR 1.26 0.00 - 1.49  Urinalysis, Routine w reflex  microscopic  Result Value Ref Range   Color, Urine YELLOW YELLOW   APPearance CLEAR CLEAR   Specific Gravity, Urine 1.016 1.005 - 1.030   pH 5.5 5.0 - 8.0   Glucose, UA NEGATIVE NEGATIVE mg/dL   Hgb urine dipstick SMALL (A) NEGATIVE   Bilirubin Urine NEGATIVE NEGATIVE   Ketones, ur NEGATIVE NEGATIVE mg/dL   Protein, ur 30 (A) NEGATIVE mg/dL   Urobilinogen, UA 0.2 0.0 - 1.0 mg/dL   Nitrite NEGATIVE NEGATIVE   Leukocytes, UA SMALL (A) NEGATIVE  Valproic acid level  Result Value Ref Range   Valproic Acid Lvl 15.8 (L) 50.0 - 100.0 ug/mL  Urine microscopic-add on  Result Value Ref Range   Squamous Epithelial / LPF RARE RARE   WBC, UA 3-6 <3 WBC/hpf   RBC / HPF 0-2 <3 RBC/hpf   Bacteria, UA RARE RARE  I-Stat CG4 Lactic Acid, ED  Result Value Ref Range   Lactic Acid, Venous 1.29 0.5 - 2.0 mmol/L    Labs Review Labs Reviewed  CBC WITH DIFFERENTIAL/PLATELET - Abnormal; Notable for the following:    WBC 15.3 (*)    RBC 4.15 (*)    Neutrophils Relative % 85 (*)    Neutro Abs 13.1 (*)    Lymphocytes Relative 8 (*)    All other components within normal limits  COMPREHENSIVE METABOLIC PANEL - Abnormal; Notable for the following:    Glucose, Bld 132 (*)    BUN 37 (*)    Creatinine, Ser 1.94 (*)    Calcium 8.3 (*)    Albumin 2.3 (*)    AST 39 (*)    GFR calc non Af Amer 29 (*)    GFR calc Af Amer 34 (*)    All other components within normal limits  PROTIME-INR - Abnormal; Notable for the following:    Prothrombin Time 15.9 (*)    All other components within normal limits   URINALYSIS, ROUTINE W REFLEX MICROSCOPIC - Abnormal; Notable for the following:    Hgb urine dipstick SMALL (*)    Protein, ur 30 (*)    Leukocytes, UA SMALL (*)    All other components within normal limits  VALPROIC ACID LEVEL - Abnormal; Notable for the following:    Valproic Acid Lvl 15.8 (*)    All other components within normal limits  CULTURE, BLOOD (ROUTINE X 2)  CULTURE, BLOOD (ROUTINE X 2)  URINE CULTURE  URINE MICROSCOPIC-ADD ON  I-STAT CG4 LACTIC ACID, ED    Imaging Review Dg Chest Port 1 View  07/29/2014   CLINICAL DATA:  81 year old with a skin ulcer.  EXAM: PORTABLE CHEST - 1 VIEW  COMPARISON:  07/20/2014  FINDINGS: Patient is rotated towards the left on this examination. Heart size is mildly enlarged but unchanged. Atherosclerotic calcifications at the aortic arch. Prominent interstitial and/or peribronchial markings are noted throughout both lungs and this appears to be chronic. No focal airspace disease.  IMPRESSION: No acute chest findings.  Prominent peribronchial and interstitial lung markings suggest chronic changes.   Electronically Signed   By: Richarda Overlie M.D.   On: 07/29/2014 16:13     EKG Interpretation None       2:07 PM Spoke with Margarette Canada, from general surgery. Discussed case, labs, history in great detail. General surgery to come and assess patient.  2:56 PM Spoke with Yehuda Mao, PA-C from [facility] reported that the wound is packed and that the wound to stop bleeding. Negative findings of infection or pus drainage noted.  4:04 PM This provider spoke with Dr. Venetia Constableanga, [facility] - discussed case, labs, imaging, vitals ED course in great detail. Recommended patient to be started on Vancomycin and Zosyn for broad spectrum antibiotics.   MDM   Final diagnoses:  Leukocytosis  Sacral decubitus ulcer, stage IV  Lethargy  Acute renal insufficiency    Medications  silver nitrate applicators applicator 10 application (not administered)   piperacillin-tazobactam (ZOSYN) IVPB 3.375 g (not administered)  vancomycin (VANCOCIN) IVPB 1000 mg/200 mL premix (not administered)  sodium chloride 0.9 % bolus 1,000 mL (1,000 mLs Intravenous New Bag/Given 07/29/14 1446)    Filed Vitals:   07/29/14 1151 07/29/14 1455  BP: 109/55 113/47  Pulse: 85 75  Temp: 97.9 F (36.6 C) 97.8 F (36.6 C)  TempSrc: Oral Oral  Resp: 16 16  SpO2: 100% 99%   This provider reviewed the patient's chart. Patient was seen and assessed in ED setting on 07/20/2014 regarding pain from the decubitus ulcer. CT abdomen and pelvis with contrast was performed that identified soft tissue right perianal inflammation that identified a necrotic/infected sacral decubitus ulcer. Bedside debridement was performed at this time. Further evaluation was recommended, patient had procedure under anesthesia performed on 07/26/2014 performed by Dr. Carolynne Edouardoth that identified a positive MRSA infection. CBC noted elevated leukocytosis of 15.3. Hemoglobin 13.0, hematocrit 40.0. CMP noted BUN of 37, creatinine 1.94 - has increased when compared to 4 days ago. Glucose elevated 132 with an anion gap of 6.0 mEq per liter. PT/INR-PT 15.9, INR 1.26. Lactic acid negative elevation. Valproic acid 15.8-doubt valproic acid toxicity. Urinalysis noted small hemoglobin with small leukocytes with white blood cell count of 3-6. Urine culture pending. Blood cultures obtained and pending. Chest x-ray no acute chest findings-prominent peribronchial and interstitial lung markings suggest chronic changes. Negative findings of UTI. Negative findings of pneumonia. Patient was seen and assessed in ED setting by surgery who packed the wound - surgery will follow patient. Spoke with [facility] - discussed case in great detail. Recommended patient to be started on vancomycin and Zosyn for broad-spectrum coverage, even though patient is already on doxycycline. Urine cultures and blood cultures are pending. Creatinine  elevated at 1.94 -beginnings of acute renal failure/injury - IV fluids administered in ED setting. Discussed plan for admission with patient and daughter-in-law at bedside. Patient did have positive wound cultures for MRSA. Mild hypotension - patient getting fluid resuscitation. Agreed to plan of care. Patient stable for transfer.  Raymon MuttonMarissa Bryndle Corredor, PA-C 07/29/14 1645  Elwin MochaBlair Walden, MD 08/01/14 [phone]

## 2014-07-29 NOTE — H&P (Signed)
Patient Demographics  Darryl Summers, is a 81 y.o. male  MRN: [id number]   DOB - 03/05/1925  Admit Date - 07/29/2014  Outpatient Primary MD for the patient is No primary care provider on file.   Assessment Darryl Summers is an 81 y/o male with Alzheimer's dementia, atrial fibrillation on aspirin, CVA, aortic insufficiency, essential hypertension, pernicious anemia, stage 4 sacral decubitus ulcer who presented to the ED with bleeding of sacral decubitus ulcer which stopped before patient was seen in the ED. Patient was discharged from [facility] yesterday after hospitalization for MRSA infection of the necrotic sacral decubitus ulcer which was debrided on 07/26/2014. He was sent to the skilled nursing facility on doxycycline but was noted to have continuous bleeding from the wound by the on-site physician at the rehabilitation facility, therefore referral to the emergency department. Apparently, pressure on the way to the emergency department stopped the bleeding. Patient was seen by surgery today, with the recommendation to admit to the medical service in view of leukocytosis and acute kidney injury. His white count remains at 15,300 from 14500 yesterday. BUN/creatinine has increased to 37/1.94 from 25/1.3 yesterday. There is no report of diarrhea to be concerned about C. difficile colitis. Unfortunately, patient is not able to give a meaningful history because of advanced dementia. It appears that infection is not adequately addressed as yet, question is whether there is osseous involvement-not seen on CT done on 07/20/2014(without contrast). Will therefore admit patient to the MedSurg floor and place him on Vancomycin/Zosyn adjusted for creatinine clearance per pharmacy. Will also give IV fluids and reassess renal function. If white count not improving, would consider reimaging the pelvic area and consulting infectious diseases. Patient likely received less than 24 hours of doxycycline at the  skilled nursing facility. If renal function not improving, would consider renal ultrasound. Will defer to surgery for further management of ulcer bleeding if it recurs. Appreciate help. Meanwhile, will avoid anticoagulants. Plan Bleeding ulcer/Sacral decubitus ulcer/Infected decubitus ulcer/Leukocytosis  Admit MedSurg  Follow septic workup  Vancomycin/Zosyn adjusted for creatinine clearance  Consider reimaging pelvic area if worsening leukocytosis, and consulting ID as necessary for antibiotic management.  Follow general surgery recommendations.  Monitor for bleeding  AKI (acute kidney injury)  Gently rehydrate and reassess  Adjust medications for creatinine clearance.  Avoid nephrotoxic medications. Chronic a-fib/Essential hypertension  Blood pressure borderline low  Monitor with IV fluids  No anticoagulation as bleeding wheeze. Dementia  No acute changes  Continue current medications DVT/GI Prophylaxis  SCDs Chief Complaint Chief Complaint  Patient presents with  . Skin Ulcer     HPI Darryl Summers  is a 81 y.o. male, who comes in from skilled nursing facility after he was noted to have bleeding in this sacral decubitus ulcer. Apparently, the physician at the rehabilitation facility noticed some bleeding on routine examination and recommended ED referral. Unfortunately, patient cannot give a history. Apparently, bleeding stopped before patient was seen by surgery in the ED. He was noted to have a clot in the same area. There is no report of diarrhea or any other major change since he was discharged on doxycycline yesterday. No report of diarrhea.  Family Communication: Admission, patients condition and plan of care including tests being ordered have been discussed with the patient and son/daughter-in-law who indicate understanding and agree with the plan and Code Status.  Code Status   DNR  Likely DC to  SNF  Condition GUARDED    Time spent in minutes :  55  Review of Systems   As in the HPI above,   Past Medical History  Diagnosis Date  . Atrial fibrillation   . History of stroke   . Hypertension   . Aortic insufficiency   . Moderate mitral regurgitation   . Stroke   . Pernicious anemia   . Peripheral vascular disease   . Peripheral neuropathy       Past Surgical History  Procedure Laterality Date  . Incision and drainage perirectal abscess N/A 07/26/2014    Procedure: debridement of sacral wound ;  Surgeon: Chevis Pretty III, MD;  Location: [facility] ORS;  Service: General;  Laterality: N/A;  prone    Social History History  Substance Use Topics  . Smoking status: Never Smoker   . Smokeless tobacco: Never Used  . Alcohol Use: No    Family History History reviewed. No pertinent family history.  Prior to Admission medications   Medication Sig Start Date End Date Taking? Authorizing Provider  acetaminophen (TYLENOL) 325 MG tablet Take 650 mg by mouth every 6 (six) hours as needed for mild pain.   Yes Historical Provider, MD  Amino Acids-Protein Hydrolys (FEEDING SUPPLEMENT, PRO-STAT SUGAR FREE 64,) LIQD Take 30 mLs by mouth 3 (three) times daily with meals. Patient taking differently: Take 30 mLs by mouth 2 (two) times daily.  07/28/14  Yes Calvert Cantor, MD  aspirin 325 MG tablet Take 325 mg by mouth daily.   Yes Historical Provider, MD  divalproex (DEPAKOTE) 125 MG DR tablet Take 125 mg by mouth 2 (two) times daily.   Yes Historical Provider, MD  donepezil (ARICEPT) 10 MG tablet Take 10 mg by mouth at bedtime.   Yes Historical Provider, MD  doxycycline (VIBRA-TABS) 100 MG tablet Take 1 tablet (100 mg total) by mouth 2 (two) times daily. 07/28/14  Yes Calvert Cantor, MD  feeding supplement (ENSURE IMMUNE HEALTH) LIQD Take 120 mLs by mouth 2 (two) times daily.    Yes Historical Provider, MD  loperamide (IMODIUM) 2 MG capsule Take 2-4 mg by mouth as needed for diarrhea or loose stools.   Yes Historical Provider, MD  LORazepam (ATIVAN) 0.5  MG tablet Take 0.5 mg by mouth every 8 (eight) hours as needed for anxiety or sleep.   Yes Historical Provider, MD  memantine (NAMENDA) 10 MG tablet Take 10 mg by mouth 2 (two) times daily.   Yes Historical Provider, MD  TUBERCULIN PPD ID Inject 1 each into the skin once.   Yes Historical Provider, MD    No Known Allergies  Physical Exam  Vitals  Blood pressure 136/70, pulse 80, temperature 98.1 F (36.7 C), temperature source Oral, resp. rate 14, SpO2 99 %.   1. General: lying in bed in NAD.  2. Normal affect and insight, Not Suicidal or Homicidal, Awake Alert  3. No F.N deficits, ALL C.Nerves Intact, Strength 5/5 all 4 extremities, Sensation intact all 4 extremities, Plantars down going.  4. Ears and Eyes appear Normal, Conjunctivae clear, PERRLA. Moist Oral Mucosa.  5. Supple Neck, No JVD, No cervical lymphadenopathy appriciated, No Carotid Bruits.  6. Symmetrical Chest wall movement, Good air movement bilaterally, CTAB.  7. RRR, No Gallops, Rubs or Murmurs, No Parasternal Heave.  8. Positive Bowel Sounds, Abdomen Soft, Non tender, No organomegaly appriciated,No rebound -guarding or rigidity. Sacral decubitus ulcer with dressing. No overt bleeding  9.  No Cyanosis, Normal Skin Turgor, No Skin Rash or Bruise.  10. Good muscle tone,  joints appear normal , no  effusions, Normal ROM.  11. No Palpable Lymph Nodes in Neck or Axillae  Data Review  CBC  Recent Labs Lab 07/25/14 0520 07/26/14 0535 07/27/14 0935 07/28/14 0804 07/29/14 1220  WBC 16.3* 16.1* 16.0* 14.5* 15.3*  HGB 13.5 13.2 14.0 13.4 13.0  HCT 41.9 40.5 42.5 41.7 40.0  PLT 216 270 257 294 310  MCV 94.8 95.7 95.3 96.5 96.4  MCH 30.5 31.2 31.4 31.0 31.3  MCHC 32.2 32.6 32.9 32.1 32.5  RDW 14.6 14.8 14.5 14.5 14.8  LYMPHSABS  --   --   --   --  1.2  MONOABS  --   --   --   --  0.9  EOSABS  --   --   --   --  0.2  BASOSABS  --   --   --   --  0.1    ------------------------------------------------------------------------------------------------------------------  Chemistries   Recent Labs Lab 07/24/14 0557 07/25/14 0520 07/27/14 0935 07/28/14 0804 07/29/14 1220  NA 139 137 138 136 136  K 3.5 3.6 3.8 4.4 4.0  CL 104 102 102 103 103  CO2 26 27 27 28 27   GLUCOSE 100* 89 105* 95 132*  BUN 27* 25* 23 25* 37*  CREATININE 1.28 1.21 1.23 1.34 1.94*  CALCIUM 8.7 8.5 8.4 8.3* 8.3*  AST  --   --   --   --  39*  ALT  --   --   --   --  27  ALKPHOS  --   --   --   --  91  BILITOT  --   --   --   --  0.7   ------------------------------------------------------------------------------------------------------------------ estimated creatinine clearance is 26.1 mL/min (by C-G formula based on Cr of 1.94). ------------------------------------------------------------------------------------------------------------------ No results for input(s): TSH, T4TOTAL, T3FREE, THYROIDAB in the last 72 hours.  Invalid input(s): FREET3   Coagulation profile  Recent Labs Lab 07/29/14 1220  INR 1.26   ------------------------------------------------------------------------------------------------------------------- No results for input(s): DDIMER in the last 72 hours. -------------------------------------------------------------------------------------------------------------------  Cardiac Enzymes No results for input(s): CKMB, TROPONINI, MYOGLOBIN in the last 168 hours.  Invalid input(s): CK ------------------------------------------------------------------------------------------------------------------ Invalid input(s): POCBNP   ---------------------------------------------------------------------------------------------------------------  Urinalysis    Component Value Date/Time   COLORURINE YELLOW 07/29/2014 1314   APPEARANCEUR CLEAR 07/29/2014 1314   LABSPEC 1.016 07/29/2014 1314   PHURINE 5.5 07/29/2014 1314   GLUCOSEU  NEGATIVE 07/29/2014 1314   HGBUR SMALL* 07/29/2014 1314   BILIRUBINUR NEGATIVE 07/29/2014 1314   KETONESUR NEGATIVE 07/29/2014 1314   PROTEINUR 30* 07/29/2014 1314   UROBILINOGEN 0.2 07/29/2014 1314   NITRITE NEGATIVE 07/29/2014 1314   LEUKOCYTESUR SMALL* 07/29/2014 1314    ----------------------------------------------------------------------------------------------------------------  Imaging results:   Ct Abdomen Pelvis Wo Contrast  07/20/2014   CLINICAL DATA:  Sacral decubitus ulceration. Worsening generalized weakness and incontinence. Initial encounter.  EXAM: CT ABDOMEN AND PELVIS WITHOUT CONTRAST  TECHNIQUE: Multidetector CT imaging of the abdomen and pelvis was performed following the standard protocol without IV contrast.  COMPARISON:  CT of the abdomen and pelvis performed 05/24/2002, and renal ultrasound performed 07/06/2007  FINDINGS: Honeycombing and scarring are noted at the visualized lung bases.  A linear 3.5 cm hypodensity within the inferior right hepatic lobe may reflect remote injury or a cyst, increased in size from 2003. Previously noted hepatic cysts appear to have resolved. The spleen is unremarkable. Stones are noted dependently within the gallbladder. The gallbladder is otherwise unremarkable. The pancreas and adrenal glands are unremarkable.  Mild bilateral hydronephrosis is  noted. This is thought to reflect marked distention of the bladder, without evidence of a distal obstructing stone. Multiple large right renal cysts are seen, measuring up to 9.1 cm in size. Nonspecific perinephric stranding is noted bilaterally. No nonobstructing renal stones are identified.  No free fluid is identified. The small bowel is unremarkable in appearance. The stomach is within normal limits. No acute vascular abnormalities are seen. Scattered calcification is noted along the abdominal aorta and its branches.  The appendix is normal in caliber, without evidence for appendicitis. Scattered  diverticulosis is noted along the proximal sigmoid colon, and a single diverticulum is noted at the proximal descending colon, without evidence of diverticulitis.  The bladder is significantly distended. The prostate is mildly enlarged, measuring 5.1 cm in transverse dimension, with scattered calcification. No inguinal lymphadenopathy is seen.  There is diffuse soft tissue inflammation about the anorectal canal, slightly more prominent on the right, with a likely associated ulceration along the overlying right-sided perineal soft tissues. Minimal air is noted within the right-sided soft tissues, likely reflect an underlying sinus tract, without a defined abscess. There is mild wall thickening along the distal rectum.  No acute osseous abnormalities are identified. Multilevel vacuum phenomenon is noted at the lower lumbar spine.  IMPRESSION: 1. Diffuse soft tissue inflammation around the anorectal canal, slightly more prominent on the right, with a likely associated ulceration along the overlying right-sided perineal soft tissues. Minimal air within the right-sided soft tissues likely reflects an underlying sinus tract, without a defined abscess. No evidence of involvement of osseous structures at this time. Mild wall thickening noted along the distal rectum. 2. Mild bilateral hydronephrosis is thought to reflect marked distention of the bladder. No evidence of distal obstructing stone. Multiple large right renal cysts seen. 3. Linear 3.5 cm hypodensity within the right hepatic lobe may reflect remote injury or a cyst. 4. Cholelithiasis; gallbladder otherwise unremarkable in appearance. 5. Scattered diverticulosis along the proximal sigmoid colon, without evidence of diverticulitis. 6. Scattered calcification along the abdominal aorta and its branches.   Electronically Signed   By: Roanna Raider M.D.   On: 07/20/2014 21:06   Ct Head Wo Contrast  07/20/2014   CLINICAL DATA:  Altered mental status.  Dementia  EXAM:  CT HEAD WITHOUT CONTRAST  TECHNIQUE: Contiguous axial images were obtained from the base of the skull through the vertex without intravenous contrast.  COMPARISON:  May 12, 2014  FINDINGS: Moderate diffuse atrophy is stable. There is no intracranial mass, hemorrhage, extra-axial fluid collection, or midline shift. 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 There is evidence of a prior infarct at the superior temporal -occipital junction on the right, stable. There is extensive small vessel disease throughout the centra semiovale bilaterally. There is small vessel disease throughout both external capsules. There is no new gray-white compartment lesion. No acute infarct apparent. Bony calvarium appears intact. The mastoid air cells are clear.  IMPRESSION: Atrophy with prior infarcts on the right as well as widespread small vessel disease. No intracranial mass, hemorrhage, or acute appearing infarct.   Electronically Signed   By: Bretta Bang M.D.   On: 07/20/2014 19:19   Dg Chest Port 1 View  07/29/2014   CLINICAL DATA:  81 year old with a skin ulcer.  EXAM: PORTABLE CHEST - 1 VIEW  COMPARISON:  07/20/2014  FINDINGS: Patient is rotated towards the left on this  examination. Heart size is mildly enlarged but unchanged. Atherosclerotic calcifications at the aortic arch. Prominent interstitial and/or peribronchial markings are noted throughout both lungs and this appears to be chronic. No focal airspace disease.  IMPRESSION: No acute chest findings.  Prominent peribronchial and interstitial lung markings suggest chronic changes.   Electronically Signed   By: Richarda Overlie M.D.   On: 07/29/2014 16:13   Dg Chest Port 1 View  07/20/2014   CLINICAL DATA:  Weakness and altered mental status  EXAM: PORTABLE CHEST - 1 VIEW  COMPARISON:  None.  FINDINGS: There is no edema or consolidation. There is  fibrotic type change in the periphery of the lungs bilaterally. Heart is mildly enlarged with pulmonary vascularity within normal limits. There is atherosclerotic change in a. No adenopathy. No bone lesions.  IMPRESSION: Peripheral fibrosis in the lungs bilaterally. No frank edema or consolidation. Mild cardiomegaly.   Electronically Signed   By: Bretta Bang M.D.   On: 07/20/2014 17:45        Ainslee Sou M.D on 07/29/2014 at 8:11 PM  Between 7am to 7pm - Pager - [phone]  After 7pm go to [email or web address] - password [email or web address]  And look for the night coverage person covering me after hours  [facility] Office  [phone]

## 2014-07-29 NOTE — Progress Notes (Signed)
Patient ID: Darryl Summers, male   DOB: 02/06/1925, 81 y.o.   MRN: [id number]  [name] living [place]     No Known Allergies     Chief Complaint  Patient presents with  . Hospitalization Follow-up  . Acute Visit    bleeding sacral wound     HPI:  He has been hospitalized for mrsa sepsis from a left gluteal wound. He had both a bedside and OR debridement performed while in the hospital. He is on doxycycline for a total of 60 doses. His foley was reinserted and returned 1000 cc clear urine. His wound is actively bleeding and I have been unable to stop bleed with direct pressure for 5 minutes.    Past Medical History  Diagnosis Date  . Atrial fibrillation   . History of stroke   . Hypertension   . Aortic insufficiency   . Moderate mitral regurgitation   . Stroke   . Pernicious anemia   . Peripheral vascular disease   . Peripheral neuropathy     Past Surgical History  Procedure Laterality Date  . Incision and drainage perirectal abscess N/A 07/26/2014    Procedure: debridement of sacral wound ;  Surgeon: Chevis Pretty III, MD;  Location: [facility]S;  Service: General;  Laterality: N/A;  prone    VITAL SIGNS BP 126/72 mmHg  Pulse 79  Ht  (1.778 m)  Wt 152 lb (68.947 kg)  BMI 21.81 kg/m2   Outpatient Encounter Prescriptions as of 07/29/2014  Medication Sig  . acetaminophen (TYLENOL) 325 MG tablet Take 650 mg by mouth every 6 (six) hours as needed for mild pain.  . Amino Acids-Protein Hydrolys (FEEDING SUPPLEMENT, PRO-STAT SUGAR FREE 64,) LIQD Take 30 mLs by mouth 3 (three) times daily with meals.  Marland Kitchen aspirin 325 MG tablet Take 325 mg by mouth daily.  . divalproex (DEPAKOTE) 125 MG DR tablet Take 125 mg by mouth 2 (two) times daily.  Marland Kitchen donepezil (ARICEPT) 10 MG tablet Take 10 mg by mouth at bedtime.  Marland Kitchen doxycycline (VIBRA-TABS) 100 MG tablet Take 1 tablet (100 mg total) by mouth 2 (two) times daily.  . feeding supplement (ENSURE IMMUNE HEALTH) LIQD Take 237 mLs by  mouth daily.  Marland Kitchen loperamide (IMODIUM) 2 MG capsule Take 2-4 mg by mouth as needed for diarrhea or loose stools.  Marland Kitchen LORazepam (ATIVAN) 0.5 MG tablet Take 0.5 mg by mouth every 8 (eight) hours as needed for anxiety or sleep.  . memantine (NAMENDA) 10 MG tablet Take 10 mg by mouth 2 (two) times daily.  . NYSTATIN EX Apply 1 application topically 3 (three) times daily. Apply to buttocks until healed     SIGNIFICANT DIAGNOSTIC EXAMS  07-20-14: chest x-ray: Peripheral fibrosis in the lungs bilaterally. No frank edema or consolidation. Mild cardiomegaly.  07-20-14: ct of head: Atrophy with prior infarcts on the right as well as widespread small vessel disease. No intracranial mass, hemorrhage, or acute appearing infarct.   07-20-14: ct of abdomen and pelvis: 1. Diffuse soft tissue inflammation around the anorectal canal, slightly more prominent on the right, with a likely associated ulceration along the overlying right-sided perineal soft tissues. Minimal air within the right-sided soft tissues likely reflects an underlying sinus tract, without a defined abscess. No evidence of involvement of osseous structures at this time. Mild wall thickening noted along the distal rectum. 2. Mild bilateral hydronephrosis is thought to reflect marked distention of the bladder. No evidence of distal obstructing stone. Multiple large right renal cysts  seen. 3. Linear 3.5 cm hypodensity within the right hepatic lobe may reflect remote injury or a cyst. 4. Cholelithiasis; gallbladder otherwise unremarkable in appearance. 5. Scattered diverticulosis along the proximal sigmoid colon, without evidence of diverticulitis. 6. Scattered calcification along the abdominal aorta and its branches.    LABS REVIEWED:   07-20-14: wbc 28.3; hgb 13.7; hct 42.2; mcv 96.1; plt 203; glucose 135; bun 67; creat 2.63; k+3.7; na++135; ast 73; albumin 3.4; blood culture: no growth; urine culture: no growth 07-21-14: wound culture (scarum)  MRSA 07-25-14: wbc 16.3; hgb 13.5; hct 41.9; mcv 94.8; plt 216; glucose 89; bun 25; creat 1.21; k+3.6; na++137 07-28-14: wbc 14.5; hgb 13.4 hct 41.7; mcv 96.5; plt 294; glucose 95; bun 25; creat 1.34; k+4.4; na++136    Review of Systems  Constitutional: Negative for malaise/fatigue.  Respiratory: Negative for cough.   Cardiovascular: Negative for chest pain.  Gastrointestinal: Negative for abdominal pain.  Musculoskeletal: Negative for myalgias and joint pain.  Skin:       Has a sore  Psychiatric/Behavioral: The patient is not nervous/anxious.      Physical Exam  Constitutional: No distress.  Frail   Neck: Neck supple. No JVD present. No thyromegaly present.  Cardiovascular: Normal rate, regular rhythm and intact distal pulses.   Respiratory: Effort normal and breath sounds normal. No respiratory distress.  GI: Soft. Bowel sounds are normal. He exhibits no distension. There is no tenderness.  Rectal exam normal tone; guaiac negative   Genitourinary:  Has foley   Musculoskeletal: He exhibits no edema.  Is able to move all extremities   Neurological: He is alert.  Skin: Skin is warm and dry. He is not diaphoretic.  Left gluteal wound has tunneling present; is actively bleeding present wound is red       ASSESSMENT/ PLAN:  1. Gluteal wound: has mrsa; he will need to complete his 30 days of doxycycline will send him to the ER for further treatment to stop bleeding and to ensure that his hgb is stable.   2. Afib: heart rate is stable he is presently taking asa 325 mg daily   3. Dementia: no change in status; he is on aricept 10 mg daily and namenda 10 mg twice daily   4. Sepsis due to mrsa: he is on doxycyline 100 mg twice daily for a total of 30 days;    Will send him to the ED for further evaluation of his wound    Time spent with patient 50 minutes.   Synthia Innocenteborah Makynlie Rossini NP [facility]  Contact [phone] Monday through Friday 8am- 5pm  After hours call  [phone]

## 2014-07-29 NOTE — ED Notes (Signed)
Bed: WA22 Expected date:  Expected time:  Means of arrival:  Comments: EMS 

## 2014-07-29 NOTE — Progress Notes (Signed)
ANTIBIOTIC CONSULT NOTE - INITIAL  Pharmacy Consult for vancomycin and Zosyn Indication: stage IV decubitus ulcer  No Known Allergies  Patient Measurements: Weight: 68.9kg IBW: 73kg  Vital Signs: Temp: 97.8 F (36.6 C) (02/12 1455) Temp Source: Oral (02/12 1455) BP: 113/47 mmHg (02/12 1455) Pulse Rate: 75 (02/12 1455) Labs:  Recent Labs  07/27/14 0935 07/28/14 0804 07/29/14 1220  WBC 16.0* 14.5* 15.3*  HGB 14.0 13.4 13.0  PLT 257 294 310  CREATININE 1.23 1.34 1.94*   Medical History: Past Medical History  Diagnosis Date  . Atrial fibrillation   . History of stroke   . Hypertension   . Aortic insufficiency   . Moderate mitral regurgitation   . Stroke   . Pernicious anemia   . Peripheral vascular disease   . Peripheral neuropathy      Assessment: [age] yoM re-admitted 2/12 from SNF with bleeding decubitus ulcer. Patient with recent admission 2/3-2/11 for sepsis secondary to this ulcer. Pt is s/p I&D of eschar in OR on 2/4, again 2/6, and 2/9. Pt received vancomycin and Zosyn during previous admission and was discharged on PO doxycyline for an additional 30 days. Per ED provider note, ulcer negative for findings of infection or pus. Pharmacy is consulted to dose vancomycin and Zosyn as broad spectrum coverage as patient with leukocytosis on admit..  Antiinfectives  PTA >> doxycycline >> 2/12 2/12 >> vancomycin >> 2/12 >> Zosyn >>    Labs / vitals Tmax: afebrile WBCs: elevated to 15.3 Renal: SCr elevated to 1.94 (recent values range from 1.2-2.7 in Epic), CrCl 26 ml/min CG/N  Microbiology recent admission 2/3 blood X2: NGF 2/3 urine: NGF 2/4 MRSA PCR: negative 2/4 sacral wound: abundant MRSA 2/9 sacral wound: abundant MRSA  Microbiology this admission 2/12 blood x2: IP 2/12 urine: IP   Drug level / dose changes info: Previous vancomycin trough 2/9 @ 1339 = 21.9 on 750mg  q12h, dose reduced to 1250mg  daily with CrCl ~40 ml/min at that time  Goal of  Therapy:  Vancomycin trough level 11-15 mcg/ml Zosyn per indication and renal function  Plan:  - vancomycin 1g IV x1 - vancomycin 500mg  IV q24h to start 2/13 at 2000 - Zosyn 3.375G IV q8h, each dose to be infused over 4 hours - vancomycin trough at steady state if indicated - follow-up clinical course, culture results, renal function - follow-up antibiotic de-escalation and length of therapy  Thank you for the consult.  Ross LudwigJesse Carlicia Leavens Akers, PharmD, BCPS Pager: [phone] Pharmacy: [phone] 07/29/2014 6:30 PM

## 2014-07-29 NOTE — Progress Notes (Signed)
eLink Physician-Brief Progress Note Patient Name: Darryl Summers Kester Carrol DOB: 06/17/1924 MRN: [id number]   Date of Service  07/29/2014  HPI/Events of Note    eICU Interventions  SCds for DVT proph     Intervention Category Intermediate Interventions: Best-practice therapies (e.g. DVT, beta blocker, etc.)  Yasmen Cortner V. 07/29/2014, 6:24 PM

## 2014-07-29 NOTE — Progress Notes (Signed)
Clinical Social Work Department BRIEF PSYCHOSOCIAL ASSESSMENT 07/29/2014  Patient:  Darryl Summers, Darryl Summers     Account Number:  [id number]     [place] date:  07/29/2014  Clinical Social Worker:  Saivon Prowse Inez Catalina  Date/Time:  07/29/2014 12:00 M  Referred by:  CSW  Date Referred:  07/29/2014 Referred for  SNF Placement   Other Referral:   Interview type:  Patient Other interview type:    PSYCHOSOCIAL DATA Living Status:  FACILITY Admitted from facility:  [place] of care:  [place] Primary support name:  Mikki Santee Primary support relationship to patient:  CHILD, ADULT Degree of support available:   strong and hcpoa    CURRENT CONCERNS Current Concerns  Post-Acute Placement   Other Concerns:    SOCIAL WORK ASSESSMENT / PLAN CSW met with family at bedside. Pt currently getting blood drawn and unable to engage in assessment due to demetnia. Pt daughter in law at bedside, and shared that patient had jsut discharged to [facility] yesterday. Patient daughter explained that patient plans to return to [facility] when medically stable. Patient daughter in law shared tha tpatient son is primary contact and hcpoa. Unit csw to follow up with son to discuss pt needs throughout admission. Pt family curious of other placement options if avialable during this admission.   Assessment/plan status:  Psychosocial Support/Ongoing Assessment of Needs Other assessment/ plan:   Information/referral to community resources:   none identified at this time    PATIENT'S/FAMILY'S RESPONSE TO PLAN OF CARE: Patient family plans for patient to return when medically stable. Pt family interested in looking into other offers if any available for placement.       Noreene Larsson [phone]  ED CSW 07/29/2014 4:18 PM

## 2014-07-29 NOTE — ED Notes (Signed)
Per EMS-had surgery on stage IV sacral ulcer last week-at [name] for rehab-states continuous bleeding-on daily ASA-here for [employer]

## 2014-07-29 NOTE — Progress Notes (Signed)
  Subjective: Pt went to SNF yesterday and comes back with bleeding from open decubitus site.  By the time I got to him the bleeding had stopped.  I did not see a site of bleeding.  He does have a clot I left intact mid portion of the open wound.  I did not see any skin bleeders.  I cleaned the site up and repacked dry.  His WBC and creatinine are up so he is going to be admitted by medicine.    Objective: Vital signs in last 24 hours: Temp:  [97.8 F (36.6 C)-97.9 F (36.6 C)] 97.8 F (36.6 C) (02/12 1455) Pulse Rate:  [72-85] 75 (02/12 1455) Resp:  [16-18] 16 (02/12 1455) BP: (109-126)/(47-72) 113/47 mmHg (02/12 1455) SpO2:  [99 %-100 %] 99 % (02/12 1455) Weight:  [68.947 kg (152 lb)] 68.947 kg (152 lb) (02/12 1041)    Intake/Output from previous day:   Intake/Output this shift:    General appearance: alert, cooperative and he has allot of discomfort with any manipulation of his open decubitus.  Currently ther is no bleeding.  He has a small clot in the mid portion of the open wound.  No active bleeding. No necrosis or purulent material noted at the side of the debridement. Skin: Open site is clean, there has been bleeding and it has stopped.  See above.  Lab Results:   Recent Labs  07/28/14 0804 07/29/14 1220  WBC 14.5* 15.3*  HGB 13.4 13.0  HCT 41.7 40.0  PLT 294 310    BMET  Recent Labs  07/28/14 0804 07/29/14 1220  NA 136 136  K 4.4 4.0  CL 103 103  CO2 28 27  GLUCOSE 95 132*  BUN 25* 37*  CREATININE 1.34 1.94*  CALCIUM 8.3* 8.3*   PT/INR  Recent Labs  07/29/14 1220  LABPROT 15.9*  INR 1.26     Recent Labs Lab 07/29/14 1220  AST 39*  ALT 27  ALKPHOS 91  BILITOT 0.7  PROT 6.9  ALBUMIN 2.3*     Lipase  No results found for: LIPASE   Studies/Results: No results found.  Medications:   Assessment/Plan Sepsis Infected necrotic decubitus ulcer S/p I & D of sacral decubitus 07/26/14 Day 6 of Vancomycin Chronic AF Stage III chronic  renal disease Advanced Alzheimer's disease Bilateral hydronephrosis SCD for DVT prophylaxis   Plan:  Bleeding controlled with pressure.  No bleeding now, I have repacked it dry.  He lies on it all the time so no other pressure is needed now.  Medicine is going to admit and we will follow.  I will ask them to get some silver nitrate sticks up to bedside in case he bleeds again.   We will follow while he is here.       Naketa Daddario 07/29/2014

## 2014-07-30 DIAGNOSIS — L8995 Pressure ulcer of unspecified site, unstageable: Secondary | ICD-10-CM

## 2014-07-30 DIAGNOSIS — N179 Acute kidney failure, unspecified: Principal | ICD-10-CM

## 2014-07-30 DIAGNOSIS — I482 Chronic atrial fibrillation: Secondary | ICD-10-CM

## 2014-07-30 LAB — COMPREHENSIVE METABOLIC PANEL
ALK PHOS: 81 U/L (ref 39–117)
ALT: 24 U/L (ref 0–53)
ANION GAP: 3 — AB (ref 5–15)
AST: 30 U/L (ref 0–37)
Albumin: 2.1 g/dL — ABNORMAL LOW (ref 3.5–5.2)
BUN: 27 mg/dL — ABNORMAL HIGH (ref 6–23)
CALCIUM: 8 mg/dL — AB (ref 8.4–10.5)
CHLORIDE: 105 mmol/L (ref 96–112)
CO2: 28 mmol/L (ref 19–32)
Creatinine, Ser: 1.34 mg/dL (ref 0.50–1.35)
GFR calc non Af Amer: 46 mL/min — ABNORMAL LOW (ref >90)
GFR, EST AFRICAN AMERICAN: 53 mL/min — AB (ref >90)
Glucose, Bld: 92 mg/dL (ref 70–99)
POTASSIUM: 3.7 mmol/L (ref 3.5–5.1)
Sodium: 136 mmol/L (ref 135–145)
Total Bilirubin: 0.8 mg/dL (ref 0.3–1.2)
Total Protein: 6.1 g/dL (ref 6.0–8.3)

## 2014-07-30 LAB — APTT: aPTT: 35 seconds (ref 24–37)

## 2014-07-30 LAB — CBC WITH DIFFERENTIAL/PLATELET
BASOS ABS: 0.1 10*3/uL (ref 0.0–0.1)
Basophils Relative: 1 % (ref 0–1)
Eosinophils Absolute: 0.7 10*3/uL (ref 0.0–0.7)
Eosinophils Relative: 6 % — ABNORMAL HIGH (ref 0–5)
HCT: 35.1 % — ABNORMAL LOW (ref 39.0–52.0)
HEMOGLOBIN: 11.2 g/dL — AB (ref 13.0–17.0)
LYMPHS PCT: 15 % (ref 12–46)
Lymphs Abs: 1.6 10*3/uL (ref 0.7–4.0)
MCH: 30.9 pg (ref 26.0–34.0)
MCHC: 31.9 g/dL (ref 30.0–36.0)
MCV: 96.7 fL (ref 78.0–100.0)
MONO ABS: 0.7 10*3/uL (ref 0.1–1.0)
Monocytes Relative: 7 % (ref 3–12)
NEUTROS PCT: 71 % (ref 43–77)
Neutro Abs: 7.2 10*3/uL (ref 1.7–7.7)
PLATELETS: 308 10*3/uL (ref 150–400)
RBC: 3.63 MIL/uL — ABNORMAL LOW (ref 4.22–5.81)
RDW: 14.9 % (ref 11.5–15.5)
WBC: 10.1 10*3/uL (ref 4.0–10.5)

## 2014-07-30 LAB — MAGNESIUM: MAGNESIUM: 2.1 mg/dL (ref 1.5–2.5)

## 2014-07-30 LAB — PROTIME-INR
INR: 1.44 (ref 0.00–1.49)
Prothrombin Time: 17.7 seconds — ABNORMAL HIGH (ref 11.6–15.2)

## 2014-07-30 LAB — PHOSPHORUS: PHOSPHORUS: 2.8 mg/dL (ref 2.3–4.6)

## 2014-07-30 MED ORDER — VANCOMYCIN HCL IN DEXTROSE 1-5 GM/200ML-% IV SOLN
1000.0000 mg | INTRAVENOUS | Status: DC
  Administered 2014-07-30: 1000 mg via INTRAVENOUS
  Filled 2014-07-30: qty 200

## 2014-07-30 NOTE — Progress Notes (Signed)
ANTIBIOTIC CONSULT NOTE - follow up  Pharmacy Consult for vancomycin  Indication: stage IV decubitus ulcer  No Known Allergies  Patient Measurements: Weight: 68.9kg IBW: 73kg  Vital Signs: Temp: 97.9 F (36.6 C) (02/13 0546) Temp Source: Oral (02/13 0546) BP: 115/59 mmHg (02/13 0546) Pulse Rate: 80 (02/13 0546) Labs:  Recent Labs  07/28/14 0804 07/29/14 1220 07/30/14 0535  WBC 14.5* 15.3* 10.1  HGB 13.4 13.0 11.2*  PLT 294 310 308  CREATININE 1.34 1.94* 1.34   Medical History: Past Medical History  Diagnosis Date  . Atrial fibrillation   . History of stroke   . Hypertension   . Aortic insufficiency   . Moderate mitral regurgitation   . Stroke   . Pernicious anemia   . Peripheral vascular disease   . Peripheral neuropathy      Assessment: [age] yoM re-admitted 2/12 from SNF with bleeding decubitus ulcer. Patient with recent admission 2/3-2/11 for sepsis secondary to this ulcer. Pt is s/p I&D of eschar in OR on 2/4, again 2/6, and 2/9. Pt received vancomycin and Zosyn during previous admission and was discharged on PO doxycyline for an additional 30 days. Per ED provider note, ulcer negative for findings of infection or pus. Pharmacy is consulted to dose vancomycin.  Antiinfectives  PTA >> doxycycline >> 2/12 2/12 >> vancomycin >> 2/12 >> Zosyn >>  2/13  Today: CrCl ~ 37.8  Drug level / dose changes info: Previous vancomycin trough 2/9 @ 1339 = 21.9 on 750mg  q12h, dose reduced to 1250mg  daily with CrCl ~40 ml/min at that time  Goal of Therapy:  Vancomycin trough level 11-15 mcg/ml  Plan:  - increase vancomycin dose to 1gm IV q24h - vancomycin trough at steady state if indicated - follow-up clinical course, culture results, renal function   Arley Phenixllen Hilbert Briggs RPh 07/30/2014, 12:31 PM Pager [phone]

## 2014-07-30 NOTE — Progress Notes (Signed)
[facility] Progress Note   Harish Wlliam Grosso MVH:[id number] DOB: 1924-08-08 DOA: 07/29/2014 PCP: No primary care provider on file.  Brief narrative: Darryl Summers is a 81 y.o. male with Alzheimer's dementia, atrial fibrillation on aspirin, CVA, aortic insufficiency, essential hypertension, pernicious anemia, and an infected stage 4 sacral decubitus ulcer who was discharged 2 days ago to SNF after debridement of the ulcer. He returned to the hospital due to bleeding from the ulcer site- the bleeding was controlled but his blood work noted acute renal failure and a persistently elevated WBC count and he was therefore referred for admission.    Subjective: Confused- no complaints.   Assessment/Plan: Principal Problem:   AKI (acute kidney injury) - suspect this is prerenal due to poor PO intake- will encourage staff to increase his PO fluid intake- he has dementia and does not ask for liquids- cont IVF hydration for today  Active Problems:   Infected decubitus ulcer with  Bleeding - s/p debridement in the OR on 2/9- significant tunneling of the ulcer was noted (had 2 tunnels) - culture grew out MRSA and he was transitioned to Doxycycline on discharge - WBC count was 14 on the day of d/c and was 15 when he returned to the ER and therefore, Vanc and Zosyn were started when he was re-admitted yesterday - WBC count has normalized today and per surgeon who has examined the ulcer, the ulcer does not appear to be infected at this time at least externally.  -- will d/c Zosyn - will cont Vanc for today and transition to Doxy again on discharge  Chronic atrial fibrillation: Not on rate control medications PTA. Not on anticoagulation despite high CHA2DS2- VASc score 5,  Acute on stage III chronic kidney disease: Likely secondary to sepsis, hypovolemia. Resolved  Advanced Alzheimer's disease: Pleasantly confused but no behavioral disturbances. Continue Aricept and Namenda. Mental  status at baseline. Continue divalproex.   Essential hypertension: Controlled without home medications. Monitor.     Code Status: DNR Family Communication:  Disposition Plan: return to SNF DVT prophylaxis: Levenox  Consultants: surgery  Procedures:   Antibiotics: Anti-infectives    Start     Dose/Rate Route Frequency Ordered Stop   07/30/14 2359  vancomycin (VANCOCIN) 500 mg in sodium chloride 0.9 % 100 mL IVPB     500 mg 100 mL/hr over 60 Minutes Intravenous Every 24 hours 07/29/14 1829     07/30/14 0800  piperacillin-tazobactam (ZOSYN) IVPB 3.375 g     3.375 g 12.5 mL/hr over 240 Minutes Intravenous Every 8 hours 07/29/14 1829     07/29/14 1700  vancomycin (VANCOCIN) IVPB 1000 mg/200 mL premix     1,000 mg 200 mL/hr over 60 Minutes Intravenous  Once 07/29/14 1630 07/30/14 0053   07/29/14 1630  piperacillin-tazobactam (ZOSYN) IVPB 3.375 g     3.375 g 100 mL/hr over 30 Minutes Intravenous  Once 07/29/14 1629 07/30/14 0023         Objective: Filed Weights   07/30/14 0900  Weight: 68.9 kg (151 lb 14.4 oz)    Intake/Output Summary (Last 24 hours) at 07/30/14 1049 Last data filed at 07/30/14 0948  Gross per 24 hour  Intake      0 ml  Output   1250 ml  Net  -1250 ml     Vitals Filed Vitals:   07/29/14 1745 07/29/14 2154 07/30/14 0546 07/30/14 0900  BP:  117/69 115/59   Pulse:  76 80   Temp: 98.1 F (36.7 C)  98.2 F (36.8 C) 97.9 F (36.6 C)   TempSrc: Oral Oral Oral   Resp:  16 16   Height:     (1.778 m)  Weight:    68.9 kg (151 lb 14.4 oz)  SpO2:  97% 96%     Exam: General: alert but confused, No acute respiratory distress Lungs: Clear to auscultation bilaterally without wheezes or crackles Cardiovascular: Regular rate and rhythm without murmur gallop or rub normal S1 and S2 Abdomen: Nontender, nondistended, soft, bowel sounds positive, no rebound, no ascites, no appreciable  mass Extremities: No significant cyanosis, clubbing, or edema bilateral lower extremities  Data Reviewed: Basic Metabolic Panel:  Recent Labs Lab 07/25/14 0520 07/27/14 0935 07/28/14 0804 07/29/14 1220 07/30/14 0535  NA 137 138 136 136 136  K 3.6 3.8 4.4 4.0 3.7  CL 102 102 103 103 105  CO2 GLUCOSE 89 105* 95 132* 92  BUN 25* 23 25* 37* 27*  CREATININE 1.21 1.23 1.34 1.94* 1.34  CALCIUM 8.5 8.4 8.3* 8.3* 8.0*  MG  --   --   --   --  2.1  PHOS  --   --   --   --  2.8   Liver Function Tests:  Recent Labs Lab 07/29/14 1220 07/30/14 0535  AST 39* 30  ALT 27 24  ALKPHOS 91 81  BILITOT 0.7 0.8  PROT 6.9 6.1  ALBUMIN 2.3* 2.1*   No results for input(s): LIPASE, AMYLASE in the last 168 hours. No results for input(s): AMMONIA in the last 168 hours. CBC:  Recent Labs Lab 07/26/14 0535 07/27/14 0935 07/28/14 0804 07/29/14 1220 07/30/14 0535  WBC 16.1* 16.0* 14.5* 15.3* 10.1  NEUTROABS  --   --   --  13.1* 7.2  HGB 13.2 14.0 13.4 13.0 11.2*  HCT 40.5 42.5 41.7 40.0 35.1*  MCV 95.7 95.3 96.5 96.4 96.7  PLT 270 257 294 310 308   Cardiac Enzymes: No results for input(s): CKTOTAL, CKMB, CKMBINDEX, TROPONINI in the last 168 hours. BNP (last 3 results) No results for input(s): BNP in the last 8760 hours.  ProBNP (last 3 results) No results for input(s): PROBNP in the last 8760 hours.  CBG: No results for input(s): GLUCAP in the last 168 hours.  Recent Results (from the past 240 hour(s))  Blood Culture (routine x 2)     Status: None   Collection Time: 07/20/14  5:12 PM  Result Value Ref Range Status   Specimen Description BLOOD BLOOD LEFT FOREARM  Final   Special Requests BOTTLES DRAWN AEROBIC AND ANAEROBIC 5 CC EA  Final   Culture   Final    NO GROWTH 5 DAYS Performed at [facility]    Report Status 07/26/2014 FINAL  Final  Blood Culture (routine x 2)     Status: None   Collection Time: 07/20/14  5:20 PM  Result Value Ref Range  Status   Specimen Description BLOOD LEFT ANTECUBITAL  Final   Special Requests BOTTLES DRAWN AEROBIC AND ANAEROBIC 5CC EACH  Final   Culture   Final    NO GROWTH 5 DAYS Performed at [facility]    Report Status 07/26/2014 FINAL  Final  Urine culture     Status: None   Collection Time: 07/20/14  6:16 PM  Result Value Ref Range Status   Specimen Description URINE, CATHETERIZED  Final   Special Requests NONE  Final   Colony Count NO GROWTH Performed  at [facility]   Final   Culture NO GROWTH Performed at [facility]   Final   Report Status 07/21/2014 FINAL  Final  MRSA PCR Screening     Status: None   Collection Time: 07/21/14 12:59 AM  Result Value Ref Range Status   MRSA by PCR NEGATIVE NEGATIVE Final    Comment:        The GeneXpert MRSA Assay (FDA approved for NASAL specimens only), is one component of a comprehensive MRSA colonization surveillance program. It is not intended to diagnose MRSA infection nor to guide or monitor treatment for MRSA infections.   Wound culture     Status: None   Collection Time: 07/21/14  1:17 PM  Result Value Ref Range Status   Specimen Description SACRAL  Final   Special Requests Normal  Final   Gram Stain   Final    FEW WBC PRESENT,BOTH PMN AND MONONUCLEAR NO SQUAMOUS EPITHELIAL CELLS SEEN MODERATE GRAM POSITIVE COCCI IN PAIRS IN CLUSTERS Performed at [facility]    Culture   Final    ABUNDANT METHICILLIN RESISTANT STAPHYLOCOCCUS AUREUS Note: RIFAMPIN AND GENTAMICIN SHOULD NOT BE USED AS SINGLE DRUGS FOR TREATMENT OF STAPH INFECTIONS. This organism DOES NOT demonstrate inducible Clindamycin resistance in vitro. CRITICAL RESULT CALLED TO, READ BACK BY AND VERIFIED WITH: KIM C 2/7 @830  BY  REAMM Performed at [facility]    Report Status 07/24/2014 FINAL  Final   Organism ID, Bacteria METHICILLIN RESISTANT STAPHYLOCOCCUS AUREUS  Final      Susceptibility   Methicillin resistant  staphylococcus aureus - MIC*    CLINDAMYCIN <=0.25 SENSITIVE Sensitive     ERYTHROMYCIN >=8 RESISTANT Resistant     GENTAMICIN <=0.5 SENSITIVE Sensitive     LEVOFLOXACIN >=8 RESISTANT Resistant     OXACILLIN >=4 RESISTANT Resistant     PENICILLIN >=0.5 RESISTANT Resistant     RIFAMPIN <=0.5 SENSITIVE Sensitive     TRIMETH/SULFA <=10 SENSITIVE Sensitive     VANCOMYCIN <=0.5 SENSITIVE Sensitive     TETRACYCLINE <=1 SENSITIVE Sensitive     * ABUNDANT METHICILLIN RESISTANT STAPHYLOCOCCUS AUREUS  Anaerobic culture     Status: None (Preliminary result)   Collection Time: 07/26/14 10:38 AM  Result Value Ref Range Status   Specimen Description WOUND SACRAL DECUBITIS  Final   Special Requests NONE  Final   Gram Stain   Final    MODERATE WBC PRESENT, PREDOMINANTLY PMN NO SQUAMOUS EPITHELIAL CELLS SEEN RARE GRAM POSITIVE COCCI IN PAIRS Performed at [facility]    Culture   Final    NO ANAEROBES ISOLATED; CULTURE IN PROGRESS FOR 5 DAYS Performed at [facility]    Report Status PENDING  Incomplete  Wound culture     Status: None   Collection Time: 07/26/14 10:38 AM  Result Value Ref Range Status   Specimen Description WOUND SACRAL DECUBITIS  Final   Special Requests NONE  Final   Gram Stain   Final    ABUNDANT WBC PRESENT,BOTH PMN AND MONONUCLEAR NO SQUAMOUS EPITHELIAL CELLS SEEN FEW GRAM POSITIVE COCCI IN PAIRS IN CLUSTERS Performed at [facility]    Culture   Final    ABUNDANT METHICILLIN RESISTANT STAPHYLOCOCCUS AUREUS Note: RIFAMPIN AND GENTAMICIN SHOULD NOT BE USED AS SINGLE DRUGS FOR TREATMENT OF STAPH INFECTIONS. This organism DOES NOT demonstrate inducible Clindamycin resistance in vitro. Performed at [facility]    Report Status 07/29/2014 FINAL  Final   Organism ID,  Bacteria METHICILLIN RESISTANT STAPHYLOCOCCUS AUREUS  Final      Susceptibility   Methicillin resistant staphylococcus aureus - MIC*    CLINDAMYCIN <=0.25 SENSITIVE  Sensitive     ERYTHROMYCIN >=8 RESISTANT Resistant     GENTAMICIN <=0.5 SENSITIVE Sensitive     LEVOFLOXACIN >=8 RESISTANT Resistant     OXACILLIN >=4 RESISTANT Resistant     PENICILLIN >=0.5 RESISTANT Resistant     RIFAMPIN <=0.5 SENSITIVE Sensitive     TRIMETH/SULFA <=10 SENSITIVE Sensitive     VANCOMYCIN 1 SENSITIVE Sensitive     TETRACYCLINE <=1 SENSITIVE Sensitive     * ABUNDANT METHICILLIN RESISTANT STAPHYLOCOCCUS AUREUS     Studies:  Recent x-ray studies have been reviewed in detail by the Attending Physician  Scheduled Meds:  Scheduled Meds: . divalproex  125 mg Oral BID  . donepezil  10 mg Oral QHS  . feeding supplement (PRO-STAT SUGAR FREE 64)  30 mL Oral TID WC  . memantine  10 mg Oral BID  . piperacillin-tazobactam (ZOSYN)  IV  3.375 g Intravenous Q8H  . vancomycin  500 mg Intravenous Q24H   Continuous Infusions: . sodium chloride 50 mL/hr at 07/29/14 1920    Time spent on care of this patient: 35 min   Vash Quezada, MD 07/30/2014, 10:49 AM  LOS: 1 day   [facility] Office  [phone] Pager - Text Page per [email or web address]  If 7PM-7AM, please contact night-coverage [email or web address]

## 2014-07-30 NOTE — Progress Notes (Signed)
General Surgery Note  LOS: 1 day  POD -     Assessment/Plan: 1.  Sacral Decubitus - right buttocks  [photo in PE of note]  The wound is clean - no further surgical management at this time  Will check again on Monday   Active Problems:   Infected decubitus ulcer   Chronic a-fib   Dementia   Essential hypertension   Leukocytosis   Sacral decubitus ulcer   Bleeding ulcer   AKI (acute kidney injury)   Subjective:  Alert, but I'm not sure how oriented he is.  He says he can walk, but I'm not sure how stable he is. Objective:   Filed Vitals:   07/30/14 0546  BP: 115/59  Pulse: 80  Temp: 97.9 F (36.6 C)  Resp: 16     Intake/Output from previous day:  02/12 0701 - 02/13 0700 In: -  Out: 950 [Urine:950]  Intake/Output this shift:  Total I/O In: -  Out: 300 [Urine:300]   Physical Exam:   General: Old thin WM who is alert.   Wound: 6 x 3 cm clean decubitus wound    Decubitus wound - right side up      Lab Results:    Recent Labs  07/29/14 1220 07/30/14 0535  WBC 15.3* 10.1  HGB 13.0 11.2*  HCT 40.0 35.1*  PLT 310 308    BMET   Recent Labs  07/29/14 1220 07/30/14 0535  NA 136 136  K 4.0 3.7  CL 103 105  CO2 27 28  GLUCOSE 132* 92  BUN 37* 27*  CREATININE 1.94* 1.34  CALCIUM 8.3* 8.0*    PT/INR   Recent Labs  07/29/14 1220 07/30/14 0535  LABPROT 15.9* 17.7*  INR 1.26 1.44    ABG  No results for input(s): PHART, HCO3 in the last 72 hours.  Invalid input(s): PCO2, PO2   Studies/Results:  Dg Chest Port 1 View  07/29/2014   CLINICAL DATA:  81 year old with a skin ulcer.  EXAM: PORTABLE CHEST - 1 VIEW  COMPARISON:  07/20/2014  FINDINGS: Patient is rotated towards the left on this examination. Heart size is mildly enlarged but unchanged. Atherosclerotic calcifications at the aortic arch. Prominent interstitial and/or peribronchial markings are noted throughout both lungs and this appears to be chronic. No focal airspace disease.  IMPRESSION:  No acute chest findings.  Prominent peribronchial and interstitial lung markings suggest chronic changes.   Electronically Signed   By: Richarda OverlieAdam  Henn M.D.   On: 07/29/2014 16:13     Anti-infectives:   Anti-infectives    Start     Dose/Rate Route Frequency Ordered Stop   07/30/14 2359  vancomycin (VANCOCIN) 500 mg in sodium chloride 0.9 % 100 mL IVPB     500 mg 100 mL/hr over 60 Minutes Intravenous Every 24 hours 07/29/14 1829     07/30/14 0800  piperacillin-tazobactam (ZOSYN) IVPB 3.375 g     3.375 g 12.5 mL/hr over 240 Minutes Intravenous Every 8 hours 07/29/14 1829     07/29/14 1700  vancomycin (VANCOCIN) IVPB 1000 mg/200 mL premix     1,000 mg 200 mL/hr over 60 Minutes Intravenous  Once 07/29/14 1630 07/30/14 0053   07/29/14 1630  piperacillin-tazobactam (ZOSYN) IVPB 3.375 g     3.375 g 100 mL/hr over 30 Minutes Intravenous  Once 07/29/14 1629 07/30/14 0023      Darryl Kinavid Eames Dibiasio, MD, FACS Pager: [phone] [facility] Office: [phone] 07/30/2014

## 2014-07-30 NOTE — Progress Notes (Signed)
Clinical Social Work  Per handoff, patient's family possibly interested in alternative SNF placement other than [facility]. CSW went to room but no family present. CSW spoke with son Nadine Counts([name]) via phone. Son reports that he has been impressed with nursing care and at this time he would prefer for [facility]. Son reports it is important for patient to be close by so that he can visit to ensure that patient is adjusting well and getting proper treatment. Son reports he wants to make the best decision for patient and is struggling with making a decision on the spot. CSW validated son's feelings and offered for son to talk about options with family and CSW could follow up to determine his final decision. Son agreeable for CSW to keep [facility] updated on plans since the plan is for patient to return there at the moment.  CSW completed FL2 and [facility]. CSW text paged MD and requested PT/OT evaluations which are needed for insurance authorization to return to SNF.  Unk LightningHolly Krisanne Lich, LCSW  (Weekend Coverage)

## 2014-07-31 LAB — URINE CULTURE
COLONY COUNT: NO GROWTH
Culture: NO GROWTH
SPECIAL REQUESTS: NORMAL

## 2014-07-31 LAB — BASIC METABOLIC PANEL
ANION GAP: 4 — AB (ref 5–15)
BUN: 25 mg/dL — AB (ref 6–23)
CO2: 28 mmol/L (ref 19–32)
CREATININE: 1.43 mg/dL — AB (ref 0.50–1.35)
Calcium: 8.1 mg/dL — ABNORMAL LOW (ref 8.4–10.5)
Chloride: 102 mmol/L (ref 96–112)
GFR calc non Af Amer: 42 mL/min — ABNORMAL LOW (ref >90)
GFR, EST AFRICAN AMERICAN: 49 mL/min — AB (ref >90)
GLUCOSE: 81 mg/dL (ref 70–99)
Potassium: 3.9 mmol/L (ref 3.5–5.1)
SODIUM: 134 mmol/L — AB (ref 135–145)

## 2014-07-31 LAB — CBC
HEMATOCRIT: 35.4 % — AB (ref 39.0–52.0)
HEMOGLOBIN: 11.4 g/dL — AB (ref 13.0–17.0)
MCH: 31.1 pg (ref 26.0–34.0)
MCHC: 32.2 g/dL (ref 30.0–36.0)
MCV: 96.5 fL (ref 78.0–100.0)
PLATELETS: 275 10*3/uL (ref 150–400)
RBC: 3.67 MIL/uL — ABNORMAL LOW (ref 4.22–5.81)
RDW: 14.7 % (ref 11.5–15.5)
WBC: 10.6 10*3/uL — AB (ref 4.0–10.5)

## 2014-07-31 LAB — ANAEROBIC CULTURE

## 2014-07-31 NOTE — Evaluation (Signed)
Physical Therapy Evaluation Patient Details Name: Darryl Summers MRN: [id number] DOB: 05/21/1925 Today's Date: 07/31/2014   History of Present Illness  Darryl Summers is a 81 y.o. male adm with sacral decubitus, sepsis;  I & D performed 2recently when in patient, admitted from SNF with bleeding from decuitus and AKI on2/12/16past medical  history:Alzheimer's dementia,  atrial fibrillation   Clinical Impression  Evaluation limited as patient c/o increased pain  In sacral area of decubitus when sitting. Patient will benefit from PT to address problems listed in note below.    Follow Up Recommendations SNF;Supervision/Assistance - 24 hour    Equipment Recommendations  None recommended by PT    Recommendations for Other Services       Precautions / Restrictions Precautions Precautions: Fall Precaution Comments: has wound on sacrum      Mobility  Bed Mobility Overal bed mobility: Needs Assistance Bed Mobility: Rolling Rolling: Mod assist   Supine to sit: +2 for physical assistance;+2 for safety/equipment Sit to supine: Max assist;+2 for physical assistance;+2 for safety/equipment   General bed mobility comments: cues to reach for rail to assist with turning to L/R, extensive aasist to get to sitting, pushing back/retropulsive, gradually relaxed and sat more upright at midline but for only a few seconds due to c/o pain in sacral area. then began to return to sidelying, assist for legs onto bed.  Transfers Overall transfer level: Needs assistance               General transfer comment: not tested  Ambulation/Gait                Stairs            Wheelchair Mobility    Modified Rankin (Stroke Patients Only)       Balance Overall balance assessment: Needs assistance Sitting-balance support: Feet supported;Bilateral upper extremity supported Sitting balance-Leahy Scale: Poor   Postural control: Posterior lean                                    Pertinent Vitals/Pain Pain Assessment: Faces Faces Pain Scale: Hurts whole lot Pain Location: c/o pain on sacrum when sitting and rolling. Pain Descriptors / Indicators: Discomfort;Grimacing;Guarding Pain Intervention(s): Limited activity within patient's tolerance;Repositioned    Home Living Family/patient expects to be discharged to:: Skilled nursing facility                      Prior Function                 Hand Dominance        Extremity/Trunk Assessment   Upper Extremity Assessment: Generalized weakness           Lower Extremity Assessment: Generalized weakness      Cervical / Trunk Assessment: Kyphotic  Communication      Cognition Arousal/Alertness: Awake/alert   Overall Cognitive Status: History of cognitive impairments - at baseline                      General Comments      Exercises        Assessment/Plan    PT Assessment Patient needs continued PT services  PT Diagnosis Acute pain;Generalized weakness;Altered mental status   PT Problem List Decreased range of motion;Decreased strength;Decreased activity tolerance;Decreased balance;Decreased mobility;Decreased knowledge of precautions;Decreased safety awareness;Decreased knowledge of use of DME;Pain  PT Treatment Interventions  DME instruction;Gait training;Functional mobility training;Therapeutic activities;Therapeutic exercise   PT Goals (Current goals can be found in the Care Plan section) Acute Rehab PT Goals Patient Stated Goal: pt unable to state PT Goal Formulation: Patient unable to participate in goal setting Time For Goal Achievement: 08/14/14 Potential to Achieve Goals: Fair    Frequency Min 2X/week   Barriers to discharge        Co-evaluation               End of Session   Activity Tolerance: Patient limited by pain Patient left: in bed;with call bell/phone within reach;with bed alarm set Nurse Communication: Mobility  status         Time: 1050-1102 PT Time Calculation (min) (ACUTE ONLY): 12 min   Charges:   PT Evaluation $Initial PT Evaluation Tier I: 1 Procedure     PT G CodesRada Summers:        Darryl Summers Elizabeth 07/31/2014, 11:38 AM Blanchard KelchKaren Kyah Buesing PT [phone]

## 2014-07-31 NOTE — Clinical Social Work Note (Signed)
CSW received a call from RN that pt was ready for discharge  CSW provided discharge packet to RN, called facility [name] and arranged for transport to SNF  CSW discussed discharge with [name] who was at pt's bedside.  No further CSW needs  .Darryl Bubaegina Coleston Dirosa, [facility]ryl Summers [facility] Clinical Social Worker - Weekend Coverage cell #: [phone]

## 2014-07-31 NOTE — Discharge Summary (Addendum)
Physician Discharge Summary  Darryl Summers ZOX:[id number] DOB: Sep 08, 1924 DOA: 07/29/2014  PCP: No primary care provider on file.  Admit date: 07/29/2014 Discharge date: 07/31/2014  Time spent: 45 minutes  Recommendations for Outpatient Follow-up:   - recommend check CBC in 3 days- if wbc count rising, would add Augmentin for increased gr neg and anaerobic coverage. - check Bmet in 3-4 days as well to ensure he is not getting dehydrated  -Air mattress at facility -Referral to wound care center ASAP  Discharge Condition: stable Diet recommendation: regular diet- encourage liquids  Discharge Diagnoses:  Principal Problem:   AKI (acute kidney injury) Active Problems:   Infected decubitus ulcer   Chronic a-fib   Dementia   Essential hypertension   Leukocytosis   Bleeding ulcer PVD AI, Mod MR on ECHO CVA    History of present illness:  Darryl Summers is a 81 y.o. male with Alzheimer's dementia, atrial fibrillation on aspirin, CVA, aortic insufficiency, essential hypertension, pernicious anemia, and an infected stage 4 sacral decubitus ulcer who was discharged 2 days ago to SNF after debridement of the ulcer. He returned to the hospital due to bleeding from the ulcer site- the bleeding resolved but his blood work noted acute renal failure and a persistently elevated WBC count and he was therefore referred for admission.    Hospital Course:  Principal Problem:  AKI (acute kidney injury) - suspect this is prerenal due to poor PO intake- will  Nneed to have staff encourage his PO fluid intake- he has dementia and does not ask for liquids-  Active Problems:  Infected decubitus ulcer with Bleeding - s/p debridement in the OR on 2/9- significant tunneling of the ulcer was noted (had 2 tunnels) - culture grew out MRSA and he was transitioned to Doxycycline on discharge - WBC count was 14 on the day of d/c and was 15 when he returned to the ER and therefore, Vanc and Zosyn  were started when he was re-admitted yesterday - WBC count has normalized and for the past 2 days has been 10-  per surgeon who has examined the ulcer, the ulcer does not appear to be infected at this time at least externally.  -- will transition him to Doxycyline again today - recommend check CBC in 3 days- if wbc count rising, would add Augmentin for increased gr neg and anaerobic coverage. - need close f/u of wound at nursing facility and will need a referral for the wound care center  Severe protein cal malnutrition - very poor PO intake- have added Ensure TID along with Prostat  - high risk for readmission due to dehydration  Chronic atrial fibrillation: Not on rate control medications PTA. Not on anticoagulation other than full dose ASA despite high CHA2DS2- VASc score-  5,  Acute on stage III chronic kidney disease: Likely secondary to sepsis, hypovolemia. Resolved  Advanced Alzheimer's disease: Pleasantly confused but no behavioral disturbances. Continue Aricept and Namenda. Mental status at baseline. Continue divalproex.   Essential hypertension: Controlled without home medications. Monitor.   Consultations:  surgery  Discharge Exam: Filed Weights   07/30/14 0900  Weight: 68.9 kg (151 lb 14.4 oz)   Filed Vitals:   07/31/14 0609  BP: 112/58  Pulse: 67  Temp: 97.4 F (36.3 C)  Resp: 16    General: AA, remains pleasantly confused, no distress Cardiovascular: RRR, no murmurs  Respiratory: clear to auscultation bilaterally GI: soft, non-tender, non-distended, bowel sound positive  Discharge Instructions You were cared for  by a hospitalist during your hospital stay. If you have any questions about your discharge medications or the care you received while you were in the hospital after you are discharged, you can call the unit and asked to speak with the hospitalist on call if the hospitalist that took  care of you is not available. Once you are discharged, your primary care physician will handle any further medical issues. Please note that NO REFILLS for any discharge medications will be authorized once you are discharged, as it is imperative that you return to your primary care physician (or establish a relationship with a primary care physician if you do not have one) for your aftercare needs so that they can reassess your need for medications and monitor your lab values.      Discharge Instructions    Diet - low sodium heart healthy    Complete by:  As directed      Increase activity slowly    Complete by:  As directed             Medication List    TAKE these medications        acetaminophen 325 MG tablet  Commonly known as:  TYLENOL  Take 650 mg by mouth every 6 (six) hours as needed for mild pain.     aspirin 325 MG tablet  Take 325 mg by mouth daily.     divalproex 125 MG DR tablet  Commonly known as:  DEPAKOTE  Take 125 mg by mouth 2 (two) times daily.     donepezil 10 MG tablet  Commonly known as:  ARICEPT  Take 10 mg by mouth at bedtime.     doxycycline 100 MG tablet  Commonly known as:  VIBRA-TABS  Take 1 tablet (100 mg total) by mouth 2 (two) times daily.     feeding supplement (PRO-STAT SUGAR FREE 64) Liqd  Take 30 mLs by mouth 3 (three) times daily with meals.     feeding supplement Liqd  Take 120 mLs by mouth 2 (two) times daily.     loperamide 2 MG capsule  Commonly known as:  IMODIUM  Take 2-4 mg by mouth as needed for diarrhea or loose stools.     LORazepam 0.5 MG tablet  Commonly known as:  ATIVAN  Take 0.5 mg by mouth every 8 (eight) hours as needed for anxiety or sleep.     memantine 10 MG tablet  Commonly known as:  NAMENDA  Take 10 mg by mouth 2 (two) times daily.     TUBERCULIN PPD ID  Inject 1 each into the skin once.       No Known Allergies    The results of significant diagnostics from this hospitalization (including  imaging, microbiology, ancillary and laboratory) are listed below for reference.    Significant Diagnostic Studies: Ct Abdomen Pelvis Wo Contrast  07/20/2014   CLINICAL DATA:  Sacral decubitus ulceration. Worsening generalized weakness and incontinence. Initial encounter.  EXAM: CT ABDOMEN AND PELVIS WITHOUT CONTRAST  TECHNIQUE: Multidetector CT imaging of the abdomen and pelvis was performed following the standard protocol without IV contrast.  COMPARISON:  CT of the abdomen and pelvis performed 05/24/2002, and renal ultrasound performed 07/06/2007  FINDINGS: Honeycombing and scarring are noted at the visualized lung bases.  A linear 3.5 cm hypodensity within the inferior right hepatic lobe may reflect remote injury or a cyst, increased in size from 2003. Previously noted hepatic cysts appear to have resolved. The spleen is unremarkable.  Stones are noted dependently within the gallbladder. The gallbladder is otherwise unremarkable. The pancreas and adrenal glands are unremarkable.  Mild bilateral hydronephrosis is noted. This is thought to reflect marked distention of the bladder, without evidence of a distal obstructing stone. Multiple large right renal cysts are seen, measuring up to 9.1 cm in size. Nonspecific perinephric stranding is noted bilaterally. No nonobstructing renal stones are identified.  No free fluid is identified. The small bowel is unremarkable in appearance. The stomach is within normal limits. No acute vascular abnormalities are seen. Scattered calcification is noted along the abdominal aorta and its branches.  The appendix is normal in caliber, without evidence for appendicitis. Scattered diverticulosis is noted along the proximal sigmoid colon, and a single diverticulum is noted at the proximal descending colon, without evidence of diverticulitis.  The bladder is significantly distended. The prostate is mildly enlarged, measuring 5.1 cm in transverse dimension, with scattered  calcification. No inguinal lymphadenopathy is seen.  There is diffuse soft tissue inflammation about the anorectal canal, slightly more prominent on the right, with a likely associated ulceration along the overlying right-sided perineal soft tissues. Minimal air is noted within the right-sided soft tissues, likely reflect an underlying sinus tract, without a defined abscess. There is mild wall thickening along the distal rectum.  No acute osseous abnormalities are identified. Multilevel vacuum phenomenon is noted at the lower lumbar spine.  IMPRESSION: 1. Diffuse soft tissue inflammation around the anorectal canal, slightly more prominent on the right, with a likely associated ulceration along the overlying right-sided perineal soft tissues. Minimal air within the right-sided soft tissues likely reflects an underlying sinus tract, without a defined abscess. No evidence of involvement of osseous structures at this time. Mild wall thickening noted along the distal rectum. 2. Mild bilateral hydronephrosis is thought to reflect marked distention of the bladder. No evidence of distal obstructing stone. Multiple large right renal cysts seen. 3. Linear 3.5 cm hypodensity within the right hepatic lobe may reflect remote injury or a cyst. 4. Cholelithiasis; gallbladder otherwise unremarkable in appearance. 5. Scattered diverticulosis along the proximal sigmoid colon, without evidence of diverticulitis. 6. Scattered calcification along the abdominal aorta and its branches.   Electronically Signed   By: Roanna RaiderJeffery  Chang M.D.   On: 07/20/2014 21:06   Ct Head Wo Contrast  07/20/2014   CLINICAL DATA:  Altered mental status.  Dementia  EXAM: CT HEAD WITHOUT CONTRAST  TECHNIQUE: Contiguous axial images were obtained from the base of the skull through the vertex without intravenous contrast.  COMPARISON:  May 12, 2014  FINDINGS: Moderate diffuse atrophy is stable. There is no intracranial mass, hemorrhage, extra-axial fluid  collection, or midline shift. 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 There is evidence of a prior infarct at the superior temporal -occipital junction on the right, stable. There is extensive small vessel disease throughout the centra semiovale bilaterally. There is small vessel disease throughout both external capsules. There is no new gray-white compartment lesion. No acute infarct apparent. Bony calvarium appears intact. The mastoid air cells are clear.  IMPRESSION: Atrophy with prior infarcts on the right as well as widespread small vessel disease. No intracranial mass, hemorrhage, or acute appearing infarct.   Electronically Signed   By: Bretta BangWilliam  Woodruff M.D.   On: 07/20/2014 19:19   Dg Chest Port 1 View  07/29/2014   CLINICAL DATA:  81 year old with  a skin ulcer.  EXAM: PORTABLE CHEST - 1 VIEW  COMPARISON:  07/20/2014  FINDINGS: Patient is rotated towards the left on this examination. Heart size is mildly enlarged but unchanged. Atherosclerotic calcifications at the aortic arch. Prominent interstitial and/or peribronchial markings are noted throughout both lungs and this appears to be chronic. No focal airspace disease.  IMPRESSION: No acute chest findings.  Prominent peribronchial and interstitial lung markings suggest chronic changes.   Electronically Signed   By: Richarda Overlie M.D.   On: 07/29/2014 16:13   Dg Chest Port 1 View  07/20/2014   CLINICAL DATA:  Weakness and altered mental status  EXAM: PORTABLE CHEST - 1 VIEW  COMPARISON:  None.  FINDINGS: There is no edema or consolidation. There is fibrotic type change in the periphery of the lungs bilaterally. Heart is mildly enlarged with pulmonary vascularity within normal limits. There is atherosclerotic change in a. No adenopathy. No bone lesions.  IMPRESSION: Peripheral fibrosis in the lungs bilaterally. No frank edema or  consolidation. Mild cardiomegaly.   Electronically Signed   By: Bretta Bang M.D.   On: 07/20/2014 17:45    Microbiology: Recent Results (from the past 240 hour(s))  Wound culture     Status: None   Collection Time: 07/21/14  1:17 PM  Result Value Ref Range Status   Specimen Description SACRAL  Final   Special Requests Normal  Final   Gram Stain   Final    FEW WBC PRESENT,BOTH PMN AND MONONUCLEAR NO SQUAMOUS EPITHELIAL CELLS SEEN MODERATE GRAM POSITIVE COCCI IN PAIRS IN CLUSTERS Performed at [facility]    Culture   Final    ABUNDANT METHICILLIN RESISTANT STAPHYLOCOCCUS AUREUS Note: RIFAMPIN AND GENTAMICIN SHOULD NOT BE USED AS SINGLE DRUGS FOR TREATMENT OF STAPH INFECTIONS. This organism DOES NOT demonstrate inducible Clindamycin resistance in vitro. CRITICAL RESULT CALLED TO, READ BACK BY AND VERIFIED WITH: KIM C 2/7 @830  BY  REAMM Performed at [facility]    Report Status 07/24/2014 FINAL  Final   Organism ID, Bacteria METHICILLIN RESISTANT STAPHYLOCOCCUS AUREUS  Final      Susceptibility   Methicillin resistant staphylococcus aureus - MIC*    CLINDAMYCIN <=0.25 SENSITIVE Sensitive     ERYTHROMYCIN >=8 RESISTANT Resistant     GENTAMICIN <=0.5 SENSITIVE Sensitive     LEVOFLOXACIN >=8 RESISTANT Resistant     OXACILLIN >=4 RESISTANT Resistant     PENICILLIN >=0.5 RESISTANT Resistant     RIFAMPIN <=0.5 SENSITIVE Sensitive     TRIMETH/SULFA <=10 SENSITIVE Sensitive     VANCOMYCIN <=0.5 SENSITIVE Sensitive     TETRACYCLINE <=1 SENSITIVE Sensitive     * ABUNDANT METHICILLIN RESISTANT STAPHYLOCOCCUS AUREUS  Anaerobic culture     Status: None (Preliminary result)   Collection Time: 07/26/14 10:38 AM  Result Value Ref Range Status   Specimen Description WOUND SACRAL DECUBITIS  Final   Special Requests NONE  Final   Gram Stain   Final    MODERATE WBC PRESENT, PREDOMINANTLY PMN NO SQUAMOUS EPITHELIAL CELLS SEEN RARE GRAM POSITIVE COCCI IN PAIRS Performed  at [facility]    Culture   Final    NO ANAEROBES ISOLATED; CULTURE IN PROGRESS FOR 5 DAYS Performed at [facility]    Report Status PENDING  Incomplete  Wound culture     Status: None   Collection Time: 07/26/14 10:38 AM  Result Value Ref Range Status   Specimen Description WOUND SACRAL DECUBITIS  Final   Special Requests  NONE  Final   Gram Stain   Final    ABUNDANT WBC PRESENT,BOTH PMN AND MONONUCLEAR NO SQUAMOUS EPITHELIAL CELLS SEEN FEW GRAM POSITIVE COCCI IN PAIRS IN CLUSTERS Performed at [facility]    Culture   Final    ABUNDANT METHICILLIN RESISTANT STAPHYLOCOCCUS AUREUS Note: RIFAMPIN AND GENTAMICIN SHOULD NOT BE USED AS SINGLE DRUGS FOR TREATMENT OF STAPH INFECTIONS. This organism DOES NOT demonstrate inducible Clindamycin resistance in vitro. Performed at [facility]    Report Status 07/29/2014 FINAL  Final   Organism ID, Bacteria METHICILLIN RESISTANT STAPHYLOCOCCUS AUREUS  Final      Susceptibility   Methicillin resistant staphylococcus aureus - MIC*    CLINDAMYCIN <=0.25 SENSITIVE Sensitive     ERYTHROMYCIN >=8 RESISTANT Resistant     GENTAMICIN <=0.5 SENSITIVE Sensitive     LEVOFLOXACIN >=8 RESISTANT Resistant     OXACILLIN >=4 RESISTANT Resistant     PENICILLIN >=0.5 RESISTANT Resistant     RIFAMPIN <=0.5 SENSITIVE Sensitive     TRIMETH/SULFA <=10 SENSITIVE Sensitive     VANCOMYCIN 1 SENSITIVE Sensitive     TETRACYCLINE <=1 SENSITIVE Sensitive     * ABUNDANT METHICILLIN RESISTANT STAPHYLOCOCCUS AUREUS     Labs: Basic Metabolic Panel:  Recent Labs Lab 07/27/14 0935 07/28/14 0804 07/29/14 1220 07/30/14 0535 07/31/14 0748  NA 138 136 136 136 134*  K 3.8 4.4 4.0 3.7 3.9  CL 102 103 103 105 102  CO2 GLUCOSE 105* 95 132* 92 81  BUN 23 25* 37* 27* 25*  CREATININE 1.23 1.34 1.94* 1.34 1.43*  CALCIUM 8.4 8.3* 8.3* 8.0* 8.1*  MG  --   --   --  2.1  --   PHOS  --   --   --  2.8  --    Liver Function  Tests:  Recent Labs Lab 07/29/14 1220 07/30/14 0535  AST 39* 30  ALT 27 24  ALKPHOS 91 81  BILITOT 0.7 0.8  PROT 6.9 6.1  ALBUMIN 2.3* 2.1*   No results for input(s): LIPASE, AMYLASE in the last 168 hours. No results for input(s): AMMONIA in the last 168 hours. CBC:  Recent Labs Lab 07/27/14 0935 07/28/14 0804 07/29/14 1220 07/30/14 0535 07/31/14 0748  WBC 16.0* 14.5* 15.3* 10.1 10.6*  NEUTROABS  --   --  13.1* 7.2  --   HGB 14.0 13.4 13.0 11.2* 11.4*  HCT 42.5 41.7 40.0 35.1* 35.4*  MCV 95.3 96.5 96.4 96.7 96.5  PLT 257 294 310 308 275   Cardiac Enzymes: No results for input(s): CKTOTAL, CKMB, CKMBINDEX, TROPONINI in the last 168 hours. BNP: BNP (last 3 results) No results for input(s): BNP in the last 8760 hours.  ProBNP (last 3 results) No results for input(s): PROBNP in the last 8760 hours.  CBG: No results for input(s): GLUCAP in the last 168 hours.     SignedCalvert Cantor, MD [facility] 07/31/2014, 9:37 AM

## 2014-08-01 ENCOUNTER — Other Ambulatory Visit: Payer: Self-pay | Admitting: Adult Health

## 2014-08-01 LAB — CBC WITH DIFFERENTIAL/PLATELET
BASOS ABS: 0.1 10*3/uL (ref 0.0–0.1)
Basophils Relative: 1 % (ref 0–1)
Eosinophils Absolute: 0.7 10*3/uL (ref 0.0–0.7)
Eosinophils Relative: 7 % — ABNORMAL HIGH (ref 0–5)
HEMATOCRIT: 36.7 % — AB (ref 39.0–52.0)
Hemoglobin: 11.7 g/dL — ABNORMAL LOW (ref 13.0–17.0)
LYMPHS ABS: 1.5 10*3/uL (ref 0.7–4.0)
Lymphocytes Relative: 15 % (ref 12–46)
MCH: 30.2 pg (ref 26.0–34.0)
MCHC: 31.9 g/dL (ref 30.0–36.0)
MCV: 94.5 fL (ref 78.0–100.0)
Monocytes Absolute: 0.5 10*3/uL (ref 0.1–1.0)
Monocytes Relative: 5 % (ref 3–12)
Neutro Abs: 7.3 10*3/uL (ref 1.7–7.7)
Neutrophils Relative %: 72 % (ref 43–77)
Platelets: 345 10*3/uL (ref 150–400)
RBC: 3.88 MIL/uL — AB (ref 4.22–5.81)
RDW: 15.1 % (ref 11.5–15.5)
WBC: 10.2 10*3/uL (ref 4.0–10.5)

## 2014-08-01 LAB — BASIC METABOLIC PANEL
BUN: 24 mg/dL — ABNORMAL HIGH (ref 6–23)
CO2: 25 meq/L (ref 19–32)
CREATININE: 1.21 mg/dL (ref 0.50–1.35)
Calcium: 8.3 mg/dL — ABNORMAL LOW (ref 8.4–10.5)
Chloride: 102 mEq/L (ref 96–112)
Glucose, Bld: 82 mg/dL (ref 70–99)
Potassium: 4.5 mEq/L (ref 3.5–5.3)
Sodium: 138 mEq/L (ref 135–145)

## 2014-08-02 ENCOUNTER — Non-Acute Institutional Stay (SKILLED_NURSING_FACILITY): Payer: Medicare HMO | Admitting: Internal Medicine

## 2014-08-02 DIAGNOSIS — A4102 Sepsis due to Methicillin resistant Staphylococcus aureus: Secondary | ICD-10-CM | POA: Diagnosis not present

## 2014-08-02 DIAGNOSIS — I10 Essential (primary) hypertension: Secondary | ICD-10-CM | POA: Diagnosis not present

## 2014-08-02 DIAGNOSIS — I482 Chronic atrial fibrillation, unspecified: Secondary | ICD-10-CM

## 2014-08-02 DIAGNOSIS — L8994 Pressure ulcer of unspecified site, stage 4: Secondary | ICD-10-CM

## 2014-08-02 DIAGNOSIS — F0390 Unspecified dementia without behavioral disturbance: Secondary | ICD-10-CM

## 2014-08-02 DIAGNOSIS — N179 Acute kidney failure, unspecified: Secondary | ICD-10-CM | POA: Diagnosis not present

## 2014-08-02 DIAGNOSIS — L089 Local infection of the skin and subcutaneous tissue, unspecified: Secondary | ICD-10-CM

## 2014-08-04 LAB — CULTURE, BLOOD (ROUTINE X 2)
Culture: NO GROWTH
Culture: NO GROWTH

## 2014-08-05 ENCOUNTER — Non-Acute Institutional Stay (SKILLED_NURSING_FACILITY): Payer: Medicare HMO | Admitting: Adult Health

## 2014-08-05 DIAGNOSIS — L8994 Pressure ulcer of unspecified site, stage 4: Secondary | ICD-10-CM

## 2014-08-05 DIAGNOSIS — L089 Local infection of the skin and subcutaneous tissue, unspecified: Secondary | ICD-10-CM

## 2014-08-18 ENCOUNTER — Encounter (HOSPITAL_BASED_OUTPATIENT_CLINIC_OR_DEPARTMENT_OTHER): Payer: Medicare HMO | Attending: Internal Medicine

## 2014-08-18 DIAGNOSIS — L89312 Pressure ulcer of right buttock, stage 2: Secondary | ICD-10-CM | POA: Diagnosis present

## 2014-08-18 DIAGNOSIS — R64 Cachexia: Secondary | ICD-10-CM | POA: Insufficient documentation

## 2014-08-18 DIAGNOSIS — E860 Dehydration: Secondary | ICD-10-CM | POA: Diagnosis not present

## 2014-08-19 ENCOUNTER — Non-Acute Institutional Stay (SKILLED_NURSING_FACILITY): Payer: Medicare HMO | Admitting: Adult Health

## 2014-08-19 DIAGNOSIS — F0390 Unspecified dementia without behavioral disturbance: Secondary | ICD-10-CM | POA: Diagnosis not present

## 2014-08-19 DIAGNOSIS — L089 Local infection of the skin and subcutaneous tissue, unspecified: Secondary | ICD-10-CM

## 2014-08-19 DIAGNOSIS — L8994 Pressure ulcer of unspecified site, stage 4: Secondary | ICD-10-CM | POA: Diagnosis not present

## 2014-08-19 DIAGNOSIS — R627 Adult failure to thrive: Secondary | ICD-10-CM

## 2014-08-22 ENCOUNTER — Encounter: Payer: Self-pay | Admitting: Internal Medicine

## 2014-08-22 NOTE — Progress Notes (Signed)
Patient ID: Darryl Summers, male   DOB: 04/09/1925, 81 y.o.   MRN: [id number]    HISTORY AND PHYSICAL  Location:  [place] [facility] Service: SNF [phone])   Extended Emergency Contact Information Primary Emergency Contact: [facility] Address: [address] [place], [place] [address] Johnnette Litter of [place] Phone: [phone] Mobile Phone: [phone] Relation: Son Secondary Emergency Contact: Earleen Reaper [place] of [place] Phone: [phone] Relation: Other  Advanced Directive information  FULL  Chief Complaint  Patient presents with  . Readmit To SNF    MRSA sepsis due to sacral pressure ulcer, afib. dementia    HPI:  81 yo male seen today as a readmission into SNF for sacral ulcer s/p debridement. He has no c/o. No f/c, nausea. Appetite is okay and he is sleeping well. Pain is stable on tylenol.  He currently is on doxycycline for MRSA sepsis. Foley placed due to sacral wound and to prevent infection. He will f/u with wound care clinic. His bed has an air mattress. No nursing issues.  BP is stable on no meds.  Heart rate is controlled on no meds.   dementia stable on donepezil, depakote and namenda.  He has aortic insufficiency and moderate mitral regurgitation per 2D echo  Past Medical History  Diagnosis Date  . Atrial fibrillation   . History of stroke   . Hypertension   . Aortic insufficiency   . Moderate mitral regurgitation   . Stroke   . Pernicious anemia   . Peripheral vascular disease   . Peripheral neuropathy     Past Surgical History  Procedure Laterality Date  . Incision and drainage perirectal abscess N/A 07/26/2014    Procedure: debridement of sacral wound ;  Surgeon: Autumn Messing III, MD;  Location: [facility];  Service: General;  Laterality: N/A;  prone    No care team member to display  History   Social History  . Marital Status: Married    Spouse Name: N/A  . Number of Children: N/A  .  Years of Education: N/A   Occupational History  . Not on file.   Social History Main Topics  . Smoking status: Never Smoker   . Smokeless tobacco: Never Used  . Alcohol Use: No  . Drug Use: No  . Sexual Activity: No   Other Topics Concern  . Not on file   Social History Narrative     reports that he has never smoked. He has never used smokeless tobacco. He reports that he does not drink alcohol or use illicit drugs.  No family history on file. No family status information on file.    Immunization History  Administered Date(s) Administered  . Tdap 05/07/2013  Past medical, surgical, family and social history reviewed   No Known Allergies  Medications: Patient's Medications  New Prescriptions   No medications on file  Previous Medications   ACETAMINOPHEN (TYLENOL) 325 MG TABLET    Take 650 mg by mouth every 6 (six) hours as needed for mild pain.   AMINO ACIDS-PROTEIN HYDROLYS (FEEDING SUPPLEMENT, PRO-STAT SUGAR FREE 64,) LIQD    Take 30 mLs by mouth 3 (three) times daily with meals.   ASPIRIN 325 MG TABLET    Take 325 mg by mouth daily.   DIVALPROEX (DEPAKOTE) 125 MG DR TABLET    Take 125 mg by mouth 2 (two) times daily.   DONEPEZIL (ARICEPT) 10  MG TABLET    Take 10 mg by mouth at bedtime.   DOXYCYCLINE (VIBRA-TABS) 100 MG TABLET    Take 1 tablet (100 mg total) by mouth 2 (two) times daily.   FEEDING SUPPLEMENT (ENSURE IMMUNE HEALTH) LIQD    Take 120 mLs by mouth 2 (two) times daily.    LOPERAMIDE (IMODIUM) 2 MG CAPSULE    Take 2-4 mg by mouth as needed for diarrhea or loose stools.   LORAZEPAM (ATIVAN) 0.5 MG TABLET    Take 0.5 mg by mouth every 8 (eight) hours as needed for anxiety or sleep.   MEMANTINE (NAMENDA) 10 MG TABLET    Take 10 mg by mouth 2 (two) times daily.   TUBERCULIN PPD ID    Inject 1 each into the skin once.  Modified Medications   No medications on file  Discontinued Medications   No medications on file    Review of Systems  Unable to perform  ROS: Dementia      Filed Vitals:   08/02/14 2217  BP: 97/61  Pulse: 77  Temp: 97.7 F (36.5 C)  Weight: 147 lb (66.679 kg)   Body mass index is 21.09 kg/(m^2).  Physical Exam  Constitutional: He appears well-developed and well-nourished. No distress.  Awake and alert  HENT:  Mouth/Throat: Oropharynx is clear and moist.  Eyes: Pupils are equal, round, and reactive to light. No scleral icterus.  Neck: Neck supple.  Cardiovascular: Normal rate, regular rhythm and intact distal pulses.  Exam reveals no gallop and no friction rub.   Murmur (systolic) heard. No carotid bruit b/l; no distal LE swelling  Pulmonary/Chest: Effort normal and breath sounds normal. He has no wheezes. He has no rales. He exhibits no tenderness.  Abdominal: Soft. Bowel sounds are normal. He exhibits no distension, no abdominal bruit, no pulsatile midline mass and no mass. There is no tenderness. There is no rebound and no guarding.  Genitourinary:  Foley intact and clear yellow urine DTG  Lymphadenopathy:    He has no cervical adenopathy.  Neurological: He has normal reflexes.  Skin: Skin is warm and dry. No rash noted.  Sacral wound bandage intact  Psychiatric: He has a normal mood and affect. His behavior is normal.     Labs reviewed: Orders Only on 08/01/2014  Component Date Value Ref Range Status  . WBC 08/01/2014 10.2  4.0 - 10.5 K/uL Final  . RBC 08/01/2014 3.88* 4.22 - 5.81 MIL/uL Final  . Hemoglobin 08/01/2014 11.7* 13.0 - 17.0 g/dL Final  . HCT 08/01/2014 36.7* 39.0 - 52.0 % Final  . MCV 08/01/2014 94.5  78.0 - 100.0 fL Final  . MCH 08/01/2014 30.2  26.0 - 34.0 pg Final  . MCHC 08/01/2014 31.9  30.0 - 36.0 g/dL Final  . RDW 08/01/2014 15.1  11.5 - 15.5 % Final  . Platelets 08/01/2014 345  150 - 400 K/uL Final  . MPV 08/01/2014 Not Performed  8.6 - 12.4 fL Final  . Neutrophils Relative % 08/01/2014 72  43 - 77 % Final  . Neutro Abs 08/01/2014 7.3  1.7 - 7.7 K/uL Final  . Lymphocytes  Relative 08/01/2014 15  12 - 46 % Final  . Lymphs Abs 08/01/2014 1.5  0.7 - 4.0 K/uL Final  . Monocytes Relative 08/01/2014 5  3 - 12 % Final  . Monocytes Absolute 08/01/2014 0.5  0.1 - 1.0 K/uL Final  . Eosinophils Relative 08/01/2014 7* 0 - 5 % Final  . Eosinophils Absolute 08/01/2014 0.7  0.0 - 0.7 K/uL Final  . Basophils Relative 08/01/2014 1  0 - 1 % Final  . Basophils Absolute 08/01/2014 0.1  0.0 - 0.1 K/uL Final  . Smear Review 08/01/2014 Criteria for review not met   Final  . Sodium 08/01/2014 138  135 - 145 mEq/L Final  . Potassium 08/01/2014 4.5  3.5 - 5.3 mEq/L Final  . Chloride 08/01/2014 102  96 - 112 mEq/L Final  . CO2 08/01/2014 25  19 - 32 mEq/L Final  . Glucose, Bld 08/01/2014 82  70 - 99 mg/dL Final  . BUN 08/01/2014 24* 6 - 23 mg/dL Final  . Creat 08/01/2014 1.21  0.50 - 1.35 mg/dL Final  . Calcium 08/01/2014 8.3* 8.4 - 10.5 mg/dL Final  Admission on 07/29/2014, Discharged on 07/31/2014  Component Date Value Ref Range Status  . WBC 07/29/2014 15.3* 4.0 - 10.5 K/uL Final  . RBC 07/29/2014 4.15* 4.22 - 5.81 MIL/uL Final  . Hemoglobin 07/29/2014 13.0  13.0 - 17.0 g/dL Final  . HCT 07/29/2014 40.0  39.0 - 52.0 % Final  . MCV 07/29/2014 96.4  78.0 - 100.0 fL Final  . MCH 07/29/2014 31.3  26.0 - 34.0 pg Final  . MCHC 07/29/2014 32.5  30.0 - 36.0 g/dL Final  . RDW 07/29/2014 14.8  11.5 - 15.5 % Final  . Platelets 07/29/2014 310  150 - 400 K/uL Final  . Neutrophils Relative % 07/29/2014 85* 43 - 77 % Final  . Neutro Abs 07/29/2014 13.1* 1.7 - 7.7 K/uL Final  . Lymphocytes Relative 07/29/2014 8* 12 - 46 % Final  . Lymphs Abs 07/29/2014 1.2  0.7 - 4.0 K/uL Final  . Monocytes Relative 07/29/2014 6  3 - 12 % Final  . Monocytes Absolute 07/29/2014 0.9  0.1 - 1.0 K/uL Final  . Eosinophils Relative 07/29/2014 1  0 - 5 % Final  . Eosinophils Absolute 07/29/2014 0.2  0.0 - 0.7 K/uL Final  . Basophils Relative 07/29/2014 0  0 - 1 % Final  . Basophils Absolute 07/29/2014 0.1  0.0  - 0.1 K/uL Final  . Sodium 07/29/2014 136  135 - 145 mmol/L Final  . Potassium 07/29/2014 4.0  3.5 - 5.1 mmol/L Final  . Chloride 07/29/2014 103  96 - 112 mmol/L Final  . CO2 07/29/2014 27  19 - 32 mmol/L Final  . Glucose, Bld 07/29/2014 132* 70 - 99 mg/dL Final  . BUN 07/29/2014 37* 6 - 23 mg/dL Final  . Creatinine, Ser 07/29/2014 1.94* 0.50 - 1.35 mg/dL Final  . Calcium 07/29/2014 8.3* 8.4 - 10.5 mg/dL Final  . Total Protein 07/29/2014 6.9  6.0 - 8.3 g/dL Final  . Albumin 07/29/2014 2.3* 3.5 - 5.2 g/dL Final  . AST 07/29/2014 39* 0 - 37 U/L Final  . ALT 07/29/2014 27  0 - 53 U/L Final  . Alkaline Phosphatase 07/29/2014 91  39 - 117 U/L Final  . Total Bilirubin 07/29/2014 0.7  0.3 - 1.2 mg/dL Final  . GFR calc non Af Amer 07/29/2014 29* >90 mL/min Final  . GFR calc Af Amer 07/29/2014 34* >90 mL/min Final   Comment: (NOTE) The eGFR has been calculated using the CKD EPI equation. This calculation has not been validated in all clinical situations. eGFR's persistently <90 mL/min signify possible Chronic Kidney Disease.   . Anion gap 07/29/2014 6  5 - 15 Final  . Prothrombin Time 07/29/2014 15.9* 11.6 - 15.2 seconds Final  . INR 07/29/2014 1.26  0.00 - 1.49  Final  . Color, Urine 07/29/2014 YELLOW  YELLOW Final  . APPearance 07/29/2014 CLEAR  CLEAR Final  . Specific Gravity, Urine 07/29/2014 1.016  1.005 - 1.030 Final  . pH 07/29/2014 5.5  5.0 - 8.0 Final  . Glucose, UA 07/29/2014 NEGATIVE  NEGATIVE mg/dL Final  . Hgb urine dipstick 07/29/2014 SMALL* NEGATIVE Final  . Bilirubin Urine 07/29/2014 NEGATIVE  NEGATIVE Final  . Ketones, ur 07/29/2014 NEGATIVE  NEGATIVE mg/dL Final  . Protein, ur 07/29/2014 30* NEGATIVE mg/dL Final  . Urobilinogen, UA 07/29/2014 0.2  0.0 - 1.0 mg/dL Final  . Nitrite 07/29/2014 NEGATIVE  NEGATIVE Final  . Leukocytes, UA 07/29/2014 SMALL* NEGATIVE Final  . Valproic Acid Lvl 07/29/2014 15.8* 50.0 - 100.0 ug/mL Final   Performed at [facility]  .  Squamous Epithelial / LPF 07/29/2014 RARE  RARE Final  . WBC, UA 07/29/2014 3-6  <3 WBC/hpf Final  . RBC / HPF 07/29/2014 0-2  <3 RBC/hpf Final  . Bacteria, UA 07/29/2014 RARE  RARE Final  . Lactic Acid, Venous 07/29/2014 1.29  0.5 - 2.0 mmol/L Final  . Specimen Description 07/29/2014 BLOOD LEFT ANTECUBITAL   Final  . Special Requests 07/29/2014 BOTTLES DRAWN AEROBIC AND ANAEROBIC 5ML   Final  . Culture 07/29/2014    Final                   Value:NO GROWTH 5 DAYS Performed at [facility]   . Report Status 07/29/2014 08/04/2014 FINAL   Final  . Specimen Description 07/29/2014 BLOOD RIGHT FOREARM   Final  . Special Requests 07/29/2014 BOTTLES DRAWN AEROBIC AND ANAEROBIC 4ML   Final  . Culture 07/29/2014    Final                   Value:NO GROWTH 5 DAYS Performed at [facility]   . Report Status 07/29/2014 08/04/2014 FINAL   Final  . Specimen Description 07/29/2014 URINE, CLEAN CATCH   Final  . Special Requests 07/29/2014 Normal   Final  . [place] Count 07/29/2014    Final                   Value:NO GROWTH Performed at [facility]   . Culture 07/29/2014    Final                   Value:NO GROWTH Performed at [facility]   . Report Status 07/29/2014 07/31/2014 FINAL   Final  . WBC 07/30/2014 10.1  4.0 - 10.5 K/uL Final  . RBC 07/30/2014 3.63* 4.22 - 5.81 MIL/uL Final  . Hemoglobin 07/30/2014 11.2* 13.0 - 17.0 g/dL Final  . HCT 07/30/2014 35.1* 39.0 - 52.0 % Final  . MCV 07/30/2014 96.7  78.0 - 100.0 fL Final  . MCH 07/30/2014 30.9  26.0 - 34.0 pg Final  . MCHC 07/30/2014 31.9  30.0 - 36.0 g/dL Final  . RDW 07/30/2014 14.9  11.5 - 15.5 % Final  . Platelets 07/30/2014 308  150 - 400 K/uL Final  . Neutrophils Relative % 07/30/2014 71  43 - 77 % Final  . Neutro Abs 07/30/2014 7.2  1.7 - 7.7 K/uL Final  . Lymphocytes Relative 07/30/2014 15  12 - 46 % Final  . Lymphs Abs 07/30/2014 1.6  0.7 - 4.0 K/uL Final  . Monocytes Relative 07/30/2014 7  3 - 12  % Final  . Monocytes Absolute 07/30/2014 0.7  0.1 - 1.0 K/uL Final  .  Eosinophils Relative 07/30/2014 6* 0 - 5 % Final  . Eosinophils Absolute 07/30/2014 0.7  0.0 - 0.7 K/uL Final  . Basophils Relative 07/30/2014 1  0 - 1 % Final  . Basophils Absolute 07/30/2014 0.1  0.0 - 0.1 K/uL Final  . aPTT 07/30/2014 35  24 - 37 seconds Final  . Prothrombin Time 07/30/2014 17.7* 11.6 - 15.2 seconds Final  . INR 07/30/2014 1.44  0.00 - 1.49 Final  . Sodium 07/30/2014 136  135 - 145 mmol/L Final  . Potassium 07/30/2014 3.7  3.5 - 5.1 mmol/L Final  . Chloride 07/30/2014 105  96 - 112 mmol/L Final  . CO2 07/30/2014 28  19 - 32 mmol/L Final  . Glucose, Bld 07/30/2014 92  70 - 99 mg/dL Final  . BUN 07/30/2014 27* 6 - 23 mg/dL Final  . Creatinine, Ser 07/30/2014 1.34  0.50 - 1.35 mg/dL Final  . Calcium 07/30/2014 8.0* 8.4 - 10.5 mg/dL Final  . Total Protein 07/30/2014 6.1  6.0 - 8.3 g/dL Final  . Albumin 07/30/2014 2.1* 3.5 - 5.2 g/dL Final  . AST 07/30/2014 30  0 - 37 U/L Final  . ALT 07/30/2014 24  0 - 53 U/L Final  . Alkaline Phosphatase 07/30/2014 81  39 - 117 U/L Final  . Total Bilirubin 07/30/2014 0.8  0.3 - 1.2 mg/dL Final  . GFR calc non Af Amer 07/30/2014 46* >90 mL/min Final  . GFR calc Af Amer 07/30/2014 53* >90 mL/min Final   Comment: (NOTE) The eGFR has been calculated using the CKD EPI equation. This calculation has not been validated in all clinical situations. eGFR's persistently <90 mL/min signify possible Chronic Kidney Disease.   . Anion gap 07/30/2014 3* 5 - 15 Final  . Magnesium 07/30/2014 2.1  1.5 - 2.5 mg/dL Final  . Phosphorus 07/30/2014 2.8  2.3 - 4.6 mg/dL Final  . Sodium 07/31/2014 134* 135 - 145 mmol/L Final  . Potassium 07/31/2014 3.9  3.5 - 5.1 mmol/L Final  . Chloride 07/31/2014 102  96 - 112 mmol/L Final  . CO2 07/31/2014 28  19 - 32 mmol/L Final  . Glucose, Bld 07/31/2014 81  70 - 99 mg/dL Final  . BUN 07/31/2014 25* 6 - 23 mg/dL Final  . Creatinine, Ser  07/31/2014 1.43* 0.50 - 1.35 mg/dL Final  . Calcium 07/31/2014 8.1* 8.4 - 10.5 mg/dL Final  . GFR calc non Af Amer 07/31/2014 42* >90 mL/min Final  . GFR calc Af Amer 07/31/2014 49* >90 mL/min Final   Comment: (NOTE) The eGFR has been calculated using the CKD EPI equation. This calculation has not been validated in all clinical situations. eGFR's persistently <90 mL/min signify possible Chronic Kidney Disease.   . Anion gap 07/31/2014 4* 5 - 15 Final  . WBC 07/31/2014 10.6* 4.0 - 10.5 K/uL Final  . RBC 07/31/2014 3.67* 4.22 - 5.81 MIL/uL Final  . Hemoglobin 07/31/2014 11.4* 13.0 - 17.0 g/dL Final  . HCT 07/31/2014 35.4* 39.0 - 52.0 % Final  . MCV 07/31/2014 96.5  78.0 - 100.0 fL Final  . MCH 07/31/2014 31.1  26.0 - 34.0 pg Final  . MCHC 07/31/2014 32.2  30.0 - 36.0 g/dL Final  . RDW 07/31/2014 14.7  11.5 - 15.5 % Final  . Platelets 07/31/2014 275  150 - 400 K/uL Final    Dg Chest Port 1 View  07/29/2014   CLINICAL DATA:  81 year old with a skin ulcer.  EXAM: PORTABLE CHEST - 1 VIEW  COMPARISON:  07/20/2014  FINDINGS: Patient is rotated towards the left on this examination. Heart size is mildly enlarged but unchanged. Atherosclerotic calcifications at the aortic arch. Prominent interstitial and/or peribronchial markings are noted throughout both lungs and this appears to be chronic. No focal airspace disease.  IMPRESSION: No acute chest findings.  Prominent peribronchial and interstitial lung markings suggest chronic changes.   Electronically Signed   By: Markus Daft M.D.   On: 07/29/2014 16:13     Assessment/Plan   ICD-9-CM ICD-10-CM   1. Infected decubitus ulcer, stage IV - on doxycycline 707.00 L89.94    707.24    2. Sepsis due to methicillin resistant Staphylococcus aureus (MRSA) - resolved 038.12 A41.02    995.91    3. Dementia, without behavioral disturbance - stable on namenda, aricept and depakote 294.20 F03.90   4. AKI (acute kidney injury) - resolved 584.9 N17.9   5.  Essential hypertension - controlled 401.9 I10   6. Chronic a-fib - rate controlled on no meds 427.31 I48.2    --wound care clinic eval if not done so already. Continue Doxy until seen by wound care  --follow labs (CBC w diff and BMP)  --monitor BP and HR closely; daily vitals and record  --lorazepam prn agitation  --foley cath care as indicated  --continue nutritional supplement as ordered  --PT/OT/ST as indicated  --GOAL: short term rehab and d/c home when medically appropriate. Communicated with pt and nursing.   Deitrich Steve S. Perlie Gold  [facility] [address] [place], [place] [address] [phone] Office (Wednesdays and Fridays 8 AM - 5 PM) [phone] Cell (Monday-Friday 8 AM - 5 PM)

## 2014-08-23 MED ORDER — TRAMADOL HCL 50 MG PO TABS: 50.0000 mg | ORAL_TABLET | Freq: Four times a day (QID) | ORAL | Status: AC

## 2014-08-23 NOTE — Progress Notes (Signed)
Patient ID: Darryl Summers, male   DOB: 1924/06/20, 81 y.o.   MRN: [id number]  [name]      No Known Allergies     Chief Complaint  Patient presents with  . Acute Visit    pain management     HPI:  The nursing staff is concerned about his level of pain. He is unable to fully participate in the hpi or ros. He is verbally denying pain but nonverbally indicates pain through facial expression and being aggressive with care especially wound care.   Past Medical History  Diagnosis Date  . Atrial fibrillation   . History of stroke   . Hypertension   . Aortic insufficiency   . Moderate mitral regurgitation   . Stroke   . Pernicious anemia   . Peripheral vascular disease   . Peripheral neuropathy     Past Surgical History  Procedure Laterality Date  . Incision and drainage perirectal abscess N/A 07/26/2014    Procedure: debridement of sacral wound ;  Surgeon: Chevis Pretty III, MD;  Location: [facility];  Service: General;  Laterality: N/A;  prone    VITAL SIGNS BP 97/53 mmHg  Pulse 85  Ht  (1.778 m)  Wt 143 lb (64.864 kg)  BMI 20.52 kg/m2   Outpatient Encounter Prescriptions as of 08/05/2014  Medication Sig  . acetaminophen (TYLENOL) 325 MG tablet Take 650 mg by mouth every 6 (six) hours as needed for mild pain.  . Amino Acids-Protein Hydrolys (FEEDING SUPPLEMENT, PRO-STAT SUGAR FREE 64,) LIQD Take 30 mLs by mouth 3 (three) times daily with meals. (Patient taking differently: Take 30 mLs by mouth 2 (two) times daily. )  . aspirin 325 MG tablet Take 325 mg by mouth daily.  . divalproex (DEPAKOTE) 125 MG DR tablet Take 125 mg by mouth 2 (two) times daily.  Marland Kitchen donepezil (ARICEPT) 10 MG tablet Take 10 mg by mouth at bedtime.  Marland Kitchen doxycycline (VIBRA-TABS) 100 MG tablet Take 1 tablet (100 mg total) by mouth 2 (two) times daily.  . feeding supplement (ENSURE IMMUNE HEALTH) LIQD Take 120 mLs by mouth 2 (two) times daily.   Marland Kitchen loperamide (IMODIUM) 2 MG capsule Take  2-4 mg by mouth as needed for diarrhea or loose stools.  Marland Kitchen LORazepam (ATIVAN) 0.5 MG tablet Take 0.5 mg by mouth every 8 (eight) hours as needed for anxiety or sleep.  . memantine (NAMENDA) 10 MG tablet Take 10 mg by mouth 2 (two) times daily.  . TUBERCULIN PPD ID Inject 1 each into the skin once.     SIGNIFICANT DIAGNOSTIC EXAMS  07-20-14: chest x-ray: Peripheral fibrosis in the lungs bilaterally. No frank edema or consolidation. Mild cardiomegaly.  07-20-14: ct of head: Atrophy with prior infarcts on the right as well as widespread small vessel disease. No intracranial mass, hemorrhage, or acute appearing infarct.   07-20-14: ct of abdomen and pelvis: 1. Diffuse soft tissue inflammation around the anorectal canal, slightly more prominent on the right, with a likely associated ulceration along the overlying right-sided perineal soft tissues. Minimal air within the right-sided soft tissues likely reflects an underlying sinus tract, without a defined abscess. No evidence of involvement of osseous structures at this time. Mild wall thickening noted along the distal rectum. 2. Mild bilateral hydronephrosis is thought to reflect marked distention of the bladder. No evidence of distal obstructing stone. Multiple large right renal cysts seen. 3. Linear 3.5 cm hypodensity within the right hepatic lobe may reflect remote injury or a  cyst. 4. Cholelithiasis; gallbladder otherwise unremarkable in appearance. 5. Scattered diverticulosis along the proximal sigmoid colon, without evidence of diverticulitis. 6. Scattered calcification along the abdominal aorta and its branches.  07-29-14: chest x-ray: No acute chest findings. Prominent peribronchial and interstitial lung markings suggest chronic changes   LABS REVIEWED:   07-20-14: wbc 28.3; hgb 13.7; hct 42.2; mcv 96.1; plt 203; glucose 135; bun 67; creat 2.63; k+3.7; na++135; ast 73; albumin 3.4; blood culture: no growth; urine culture: no growth 07-21-14: wound  culture (scarum) MRSA 07-25-14: wbc 16.3; hgb 13.5; hct 41.9; mcv 94.8; plt 216; glucose 89; bun 25; creat 1.21; k+3.6; na++137 07-28-14: wbc 14.5; hgb 13.4 hct 41.7; mcv 96.5; plt 294; glucose 95; bun 25; creat 1.34; k+4.4; na++136 07-29-14: wbc 15.3; hgb 13.0; hct 40.0; mcv 96.4; plt 310; glucose 132;  Bun 37; creat 1.94; k+4.0 na++136; liver normal albumin 2.3; depakote 15.8; urine culture: no growth; blood culture: no growth 07-31-14: wbc 10.6; hgb 11.4; hct 35.4; mcv 96.5; plt 275; glucose 81; bun 25; creat 1.43; k+3.9; na++134 08-01-14: wbc 10.2; hgb 11.7; hct 36.7; mcv 94.5; plt 345; gluocse 82; bun 24; creat 1.21; k+4.5; na++138      Review of Systems  Unable to perform ROS    Physical Exam Constitutional: No distress.  Frail   Neck: Neck supple. No JVD present. No thyromegaly present.  Cardiovascular: Normal rate, regular rhythm and intact distal pulses.   Respiratory: Effort normal and breath sounds normal. No respiratory distress.  GI: Soft. Bowel sounds are normal. He exhibits no distension. There is no tenderness.   Genitourinary:  Has foley   Musculoskeletal: He exhibits no edema.  Is able to move all extremities   Neurological: He is alert.  Skin: Skin is warm and dry. He is not diaphoretic.  Left gluteal wound has tunneling present; stool in wound; will use silver alginate and duoderm to protect wound and promote healing.       ASSESSMENT/ PLAN:   1. Infected decubitus ulcer: will continue abt as directed; will use silver alginate and will cover with duoderm to help protect wound bed and promote healing. His albumin is 2.3. Will being his ultram at 5 mg every 6 hours routinely and will monitor      Synthia Innocenteborah Yekaterina Escutia NP [facility]  Contact [phone] Monday through Friday 8am- 5pm  After hours call [phone]

## 2014-09-05 DIAGNOSIS — R627 Adult failure to thrive: Secondary | ICD-10-CM | POA: Insufficient documentation

## 2014-09-05 NOTE — Progress Notes (Signed)
Patient ID: Darryl Summers, male   DOB: 09/15/1924, 81 y.o.   MRN: [id number]  [facility] [place]     No Known Allergies     Chief Complaint  Patient presents with  . Acute Visit    patient status     HPI:  He continues to decline. I have spoken with his family at great length regarding his overall status. He continues to decline. He is not eating or drinking at this time. He is now off the namenda and aricept. His family is wanting to discuss his end of life issues. They understand that his dementia is end stage; he has a stage IV non healing sacral wound and is losing weight. His weight in Feb was 152 pounds his current weight is 134 pounds. They do understand his status is end of life.     Past Medical History  Diagnosis Date  . Atrial fibrillation   . History of stroke   . Hypertension   . Aortic insufficiency   . Moderate mitral regurgitation   . Stroke   . Pernicious anemia   . Peripheral vascular disease   . Peripheral neuropathy     Past Surgical History  Procedure Laterality Date  . Incision and drainage perirectal abscess N/A 07/26/2014    Procedure: debridement of sacral wound ;  Surgeon: Chevis PrettyPaul Toth III, MD;  Location: [facility];  Service: General;  Laterality: N/A;  prone    VITAL SIGNS BP 110/74 mmHg  Pulse 68  Ht 5\' 10"  (1.778 m)  Wt 134 lb (60.782 kg)  BMI 19.23 kg/m2   Outpatient Encounter Prescriptions as of 08/19/2014  Medication Sig  . acetaminophen (TYLENOL) 325 MG tablet Take 650 mg by mouth every 6 (six) hours as needed for mild pain.  . Amino Acids-Protein Hydrolys (FEEDING SUPPLEMENT, PRO-STAT SUGAR FREE 64,) LIQD  Take 30 mLs by mouth 2 (two) times daily.  Marland Kitchen. aspirin 325 MG tablet Take 325 mg by mouth daily.  . divalproex (DEPAKOTE) 125 MG DR tablet Take 125 mg by mouth 2 (two) times daily.  Marland Kitchen. doxycycline (VIBRA-TABS) 100 MG tablet Take 1 tablet (100 mg total) by mouth 2 (two) times daily.  . feeding supplement (ENSURE IMMUNE HEALTH)  LIQD Take 120 mLs by mouth 2 (two) times daily.   Marland Kitchen. loperamide (IMODIUM) 2 MG capsule Take 2-4 mg by mouth as needed for diarrhea or loose stools.   remeron 7.5 mg  Take nightly   . LORazepam (ATIVAN) 0.5 MG tablet Take 0.5 mg by mouth every 8 (eight) hours as needed for anxiety or sleep.  . traMADol (ULTRAM) 50 MG tablet Take 1 tablet (50 mg total) by mouth 4 (four) times daily.     SIGNIFICANT DIAGNOSTIC EXAMS  07-20-14: chest x-ray: Peripheral fibrosis in the lungs bilaterally. No frank edema or consolidation. Mild cardiomegaly.  07-20-14: ct of head: Atrophy with prior infarcts on the right as well as widespread small vessel disease. No intracranial mass, hemorrhage, or acute appearing infarct.   07-20-14: ct of abdomen and pelvis: 1. Diffuse soft tissue inflammation around the anorectal canal, slightly more prominent on the right, with a likely associated ulceration along the overlying right-sided perineal soft tissues. Minimal air within the right-sided soft tissues likely reflects an underlying sinus tract, without a defined abscess. No evidence of involvement of osseous structures at this time. Mild wall thickening noted along the distal rectum. 2. Mild bilateral hydronephrosis is thought to reflect marked distention of the bladder. No evidence of  distal obstructing stone. Multiple large right renal cysts seen. 3. Linear 3.5 cm hypodensity within the right hepatic lobe may reflect remote injury or a cyst. 4. Cholelithiasis; gallbladder otherwise unremarkable in appearance. 5. Scattered diverticulosis along the proximal sigmoid colon, without evidence of diverticulitis. 6. Scattered calcification along the abdominal aorta and its branches.  07-29-14: chest x-ray: No acute chest findings. Prominent peribronchial and interstitial lung markings suggest chronic changes   LABS REVIEWED:   07-20-14: wbc 28.3; hgb 13.7; hct 42.2; mcv 96.1; plt 203; glucose 135; bun 67; creat 2.63; k+3.7; na++135;  ast 73; albumin 3.4; blood culture: no growth; urine culture: no growth 07-21-14: wound culture (scarum) MRSA 07-25-14: wbc 16.3; hgb 13.5; hct 41.9; mcv 94.8; plt 216; glucose 89; bun 25; creat 1.21; k+3.6; na++137 07-28-14: wbc 14.5; hgb 13.4 hct 41.7; mcv 96.5; plt 294; glucose 95; bun 25; creat 1.34; k+4.4; na++136 07-29-14: wbc 15.3; hgb 13.0; hct 40.0; mcv 96.4; plt 310; glucose 132;  Bun 37; creat 1.94; k+4.0 na++136; liver normal albumin 2.3; depakote 15.8; urine culture: no growth; blood culture: no growth 07-31-14: wbc 10.6; hgb 11.4; hct 35.4; mcv 96.5; plt 275; glucose 81; bun 25; creat 1.43; k+3.9; na++134 08-01-14: wbc 10.2; hgb 11.7; hct 36.7; mcv 94.5; plt 345; gluocse 82; bun 24; creat 1.21; k+4.5; na++138     Review of Systems  Unable to perform ROS    Physical Exam Constitutional: No distress.  Frail   Neck: Neck supple. No JVD present. No thyromegaly present.  Cardiovascular: Normal rate, regular rhythm and intact distal pulses.   Respiratory: Effort normal and breath sounds normal. No respiratory distress.  GI: Soft. Bowel sounds are normal. He exhibits no distension. There is no tenderness.   Genitourinary:  Has foley   Musculoskeletal: He exhibits no edema.  Is able to move all extremities   Neurological: He is alert.  Skin: Skin is warm and dry. He is not diaphoretic.  Left gluteal wound has tunneling present;     ASSESSMENT/ PLAN:  1. End stage dementia 2. FTT 3. Left gluteal wound:  Will stop the depakote Will begin roxanol 5 mg every 4 hours as needed  Will focus his care upon comfort with hoe hospitalization and no tube feeding  Will monitor his status   Time spent with patient 50 minutes.    Synthia Innocent NP [facility]  Contact [phone] Monday through Friday 8am- 5pm  After hours call [phone]

## 2014-09-12 ENCOUNTER — Non-Acute Institutional Stay: Payer: Medicare HMO | Admitting: Adult Health

## 2014-09-12 DIAGNOSIS — R5381 Other malaise: Secondary | ICD-10-CM | POA: Diagnosis not present

## 2014-09-12 DIAGNOSIS — F0390 Unspecified dementia without behavioral disturbance: Secondary | ICD-10-CM | POA: Diagnosis not present

## 2014-09-12 DIAGNOSIS — R627 Adult failure to thrive: Secondary | ICD-10-CM

## 2014-09-12 DIAGNOSIS — I482 Chronic atrial fibrillation, unspecified: Secondary | ICD-10-CM

## 2014-09-12 DIAGNOSIS — L8994 Pressure ulcer of unspecified site, stage 4: Secondary | ICD-10-CM

## 2014-09-12 DIAGNOSIS — L089 Local infection of the skin and subcutaneous tissue, unspecified: Secondary | ICD-10-CM

## 2014-10-14 ENCOUNTER — Non-Acute Institutional Stay (SKILLED_NURSING_FACILITY): Payer: Medicare HMO | Admitting: Adult Health

## 2014-10-14 DIAGNOSIS — I482 Chronic atrial fibrillation, unspecified: Secondary | ICD-10-CM

## 2014-10-14 DIAGNOSIS — F0150 Vascular dementia without behavioral disturbance: Secondary | ICD-10-CM

## 2014-10-14 DIAGNOSIS — L89324 Pressure ulcer of left buttock, stage 4: Secondary | ICD-10-CM

## 2014-10-14 DIAGNOSIS — R627 Adult failure to thrive: Secondary | ICD-10-CM

## 2014-11-14 ENCOUNTER — Encounter: Payer: Self-pay | Admitting: Adult Health

## 2014-11-14 DIAGNOSIS — R5381 Other malaise: Secondary | ICD-10-CM | POA: Insufficient documentation

## 2014-11-14 NOTE — Progress Notes (Signed)
Patient ID: Darryl Summers, male   DOB: 04/21/1925, 81 y.o.   MRN: [id number]  [facility]     No Known Allergies     Chief Complaint  Patient presents with  . Medical Management of Chronic Issues    HPI:  He is a long term resident of this facility being seen for the management of his chronic illnesses. He has started to eat better since starting the remeron. He is able to communicate; but is unable to fully participate in the hpi or ros. There are no nursing concerns today.    Past Medical History  Diagnosis Date  . Atrial fibrillation   . History of stroke   . Hypertension   . Aortic insufficiency   . Moderate mitral regurgitation   . Stroke   . Pernicious anemia   . Peripheral vascular disease   . Peripheral neuropathy     Past Surgical History  Procedure Laterality Date  . Incision and drainage perirectal abscess N/A 07/26/2014    Procedure: debridement of sacral wound ;  Surgeon: Chevis PrettyPaul Toth III, MD;  Location: [facility];  Service: General;  Laterality: N/A;  prone    VITAL SIGNS BP 126/72 mmHg  Pulse 78  Ht 5\' 10"  (1.778 m)  Wt 125 lb (56.7 kg)  BMI 17.94 kg/m2   Outpatient Encounter Prescriptions as of 09/12/2014  Medication Sig  . acetaminophen (TYLENOL) 325 MG tablet Take 650 mg by mouth every 6 (six) hours as needed for mild pain.  . Amino Acids-Protein Hydrolys (FEEDING SUPPLEMENT, PRO-STAT SUGAR FREE 64,) LIQD Take 30 mLs by mouth 3 (three) times daily with meals.  Marland Kitchen. aspirin 325 MG tablet Take 325 mg by mouth daily.  Marland Kitchen. doxycycline (VIBRA-TABS) 100 MG tablet Take 1 tablet (100 mg total) by mouth 2 (two) times daily.  . feeding supplement (ENSURE IMMUNE HEALTH) LIQD Take 120 mLs by mouth 2 (two) times daily.   Marland Kitchen. loperamide (IMODIUM) 2 MG capsule Take 2-4 mg by mouth as needed for diarrhea or loose stools.  Marland Kitchen. LORazepam (ATIVAN) 0.5 MG tablet Take 0.5 mg by mouth every 8 (eight) hours as needed for anxiety or sleep.  Marland Kitchen. morphine (ROXANOL) 20  MG/ML concentrated solution Take 5 mg by mouth every 4 (four) hours as needed for severe pain.  Marland Kitchen. MULTIPLE VITAMINS ESSENTIAL PO Take 1 tablet by mouth daily.  . traMADol (ULTRAM) 50 MG tablet Take 1 tablet (50 mg total) by mouth 4 (four) times daily.      SIGNIFICANT DIAGNOSTIC EXAMS  07-20-14: chest x-ray: Peripheral fibrosis in the lungs bilaterally. No frank edema or consolidation. Mild cardiomegaly.  07-20-14: ct of head: Atrophy with prior infarcts on the right as well as widespread small vessel disease. No intracranial mass, hemorrhage, or acute appearing infarct.   07-20-14: ct of abdomen and pelvis: 1. Diffuse soft tissue inflammation around the anorectal canal, slightly more prominent on the right, with a likely associated ulceration along the overlying right-sided perineal soft tissues. Minimal air within the right-sided soft tissues likely reflects an underlying sinus tract, without a defined abscess. No evidence of involvement of osseous structures at this time. Mild wall thickening noted along the distal rectum. 2. Mild bilateral hydronephrosis is thought to reflect marked distention of the bladder. No evidence of distal obstructing stone. Multiple large right renal cysts seen. 3. Linear 3.5 cm hypodensity within the right hepatic lobe may reflect remote injury or a cyst. 4. Cholelithiasis; gallbladder otherwise unremarkable in appearance. 5. Scattered diverticulosis  along the proximal sigmoid colon, without evidence of diverticulitis. 6. Scattered calcification along the abdominal aorta and its branches.  07-29-14: chest x-ray: No acute chest findings. Prominent peribronchial and interstitial lung markings suggest chronic changes   LABS REVIEWED:   07-20-14: wbc 28.3; hgb 13.7; hct 42.2; mcv 96.1; plt 203; glucose 135; bun 67; creat 2.63; k+3.7; na++135; ast 73; albumin 3.4; blood culture: no growth; urine culture: no growth 07-21-14: wound culture (scarum) MRSA 07-25-14: wbc 16.3; hgb  13.5; hct 41.9; mcv 94.8; plt 216; glucose 89; bun 25; creat 1.21; k+3.6; na++137 07-28-14: wbc 14.5; hgb 13.4 hct 41.7; mcv 96.5; plt 294; glucose 95; bun 25; creat 1.34; k+4.4; na++136 07-29-14: wbc 15.3; hgb 13.0; hct 40.0; mcv 96.4; plt 310; glucose 132;  Bun 37; creat 1.94; k+4.0 na++136; liver normal albumin 2.3; depakote 15.8; urine culture: no growth; blood culture: no growth 07-31-14: wbc 10.6; hgb 11.4; hct 35.4; mcv 96.5; plt 275; glucose 81; bun 25; creat 1.43; k+3.9; na++134 08-01-14: wbc 10.2; hgb 11.7; hct 36.7; mcv 94.5; plt 345; gluocse 82; bun 24; creat 1.21; k+4.5; na++138     ROS Unable to perform ROS    Physical Exam Constitutional: No distress.  Frail   Neck: Neck supple. No JVD present. No thyromegaly present.  Cardiovascular: Normal rate, regular rhythm and intact distal pulses.   Respiratory: Effort normal and breath sounds normal. No respiratory distress.  GI: Soft. Bowel sounds are normal. He exhibits no distension. There is no tenderness.   Genitourinary:  Has foley   Musculoskeletal: He exhibits no edema.  Is able to move all extremities   Neurological: He is alert.  Skin: Skin is warm and dry. He is not diaphoretic.  Left gluteal wound has tunneling present; 3.9 x 1.8 x 2.35 cm tunnel  no signs of infection present; is resolving.      ASSESSMENT/ PLAN:  1. Stage IV left gluteal wound: will continue current treatment; will continue doxycyline; roxanol 5 mg every 4 hours as needed for pain and ultram 50 mg every 6 hours as needed for pain.  and will monitor his status.   2. FTT: current weight is 125 pounds; is on remeron 7.5 mg nightly for appetite;   3. Physical deconditioning: his family is wondering if he would benefit from therapy since he appears to be doing better. Will have therapy evaluate and treat as indicated   4. Afib: heart rate is stable; will continue asa 325 mg daily   5. Dementia: is end stage: is presently not on medications; he is  able to communicate his needs; he is eating better since starting on the remeron. He has ativan 0.5 mg every 8 hours as needed for anxiety; will not make changes will monitor his status. His current weight is 125 pounds.   Synthia Innocent NP [facility]  Contact [phone] Monday through Friday 8am- 5pm  After hours call [phone]

## 2014-11-18 ENCOUNTER — Non-Acute Institutional Stay (SKILLED_NURSING_FACILITY): Payer: Medicare HMO | Admitting: Adult Health

## 2014-11-18 DIAGNOSIS — I482 Chronic atrial fibrillation, unspecified: Secondary | ICD-10-CM

## 2014-11-18 DIAGNOSIS — F0150 Vascular dementia without behavioral disturbance: Secondary | ICD-10-CM | POA: Diagnosis not present

## 2014-11-18 DIAGNOSIS — L89324 Pressure ulcer of left buttock, stage 4: Secondary | ICD-10-CM | POA: Diagnosis not present

## 2014-11-18 DIAGNOSIS — R627 Adult failure to thrive: Secondary | ICD-10-CM | POA: Diagnosis not present

## 2014-12-02 ENCOUNTER — Non-Acute Institutional Stay (SKILLED_NURSING_FACILITY): Payer: Medicare Other | Admitting: Adult Health

## 2014-12-02 DIAGNOSIS — R627 Adult failure to thrive: Secondary | ICD-10-CM | POA: Diagnosis not present

## 2014-12-02 DIAGNOSIS — F0150 Vascular dementia without behavioral disturbance: Secondary | ICD-10-CM

## 2014-12-02 DIAGNOSIS — A419 Sepsis, unspecified organism: Secondary | ICD-10-CM

## 2014-12-02 DIAGNOSIS — N390 Urinary tract infection, site not specified: Secondary | ICD-10-CM

## 2014-12-05 ENCOUNTER — Encounter: Payer: Self-pay | Admitting: Adult Health

## 2014-12-05 DIAGNOSIS — L89304 Pressure ulcer of unspecified buttock, stage 4: Secondary | ICD-10-CM | POA: Insufficient documentation

## 2014-12-05 DIAGNOSIS — F0150 Vascular dementia without behavioral disturbance: Secondary | ICD-10-CM | POA: Insufficient documentation

## 2014-12-05 DIAGNOSIS — A419 Sepsis, unspecified organism: Secondary | ICD-10-CM | POA: Insufficient documentation

## 2014-12-05 DIAGNOSIS — N390 Urinary tract infection, site not specified: Principal | ICD-10-CM

## 2014-12-05 NOTE — Progress Notes (Signed)
Patient ID: Darryl Summers, male   DOB: 1924/07/31, 81 y.o.   MRN: [id number]  Darryl Summers living [place]     No Known Allergies     Chief Complaint  Patient presents with  . Medical Management of Chronic Issues    HPI:  He is a long term resident of this facility being seen for the management of his chronic illnesses. His stage IV wound is resolving. He is not complaining of pain. He is unable to fully participate in the hpi or ros. There are no nursing concerns at this time.    Past Medical History  Diagnosis Date  . Atrial fibrillation   . History of stroke   . Hypertension   . Aortic insufficiency   . Moderate mitral regurgitation   . Stroke   . Pernicious anemia   . Peripheral vascular disease   . Peripheral neuropathy     Past Surgical History  Procedure Laterality Date  . Incision and drainage perirectal abscess N/A 07/26/2014    Procedure: debridement of sacral wound ;  Surgeon: Chevis Pretty III, MD;  Location: [facility];  Service: General;  Laterality: N/A;  prone    VITAL SIGNS BP 95/64 mmHg  Pulse 79  Ht 5\' 10"  (1.778 m)  Wt 127 lb (57.607 kg)  BMI 18.22 kg/m2   Outpatient Encounter Prescriptions as of 10/14/2014  Medication Sig  . acetaminophen (TYLENOL) 325 MG tablet Take 650 mg by mouth every 6 (six) hours as needed for mild pain.  . Amino Acids-Protein Hydrolys (FEEDING SUPPLEMENT, PRO-STAT SUGAR FREE 64,) LIQD Take 30 mLs by mouth 3 (three) times daily with meals.  Marland Kitchen aspirin 325 MG tablet Take 325 mg by mouth daily.  . feeding supplement (ENSURE IMMUNE HEALTH) LIQD Take 120 mLs by mouth 2 (two) times daily.   Marland Kitchen loperamide (IMODIUM) 2 MG capsule Take 2-4 mg by mouth as needed for diarrhea or loose stools.  Marland Kitchen LORazepam (ATIVAN) 0.5 MG tablet Take 0.5 mg by mouth every 8 (eight) hours as needed for anxiety or sleep.  Marland Kitchen morphine (ROXANOL) 20 MG/ML concentrated solution Take 5 mg by mouth every 4 (four) hours as needed for severe pain.  Marland Kitchen MULTIPLE VITAMINS  ESSENTIAL PO Take 1 tablet by mouth daily.  . traMADol (ULTRAM) 50 MG tablet Take 1 tablet (50 mg total) by mouth 4 (four) times daily.     SIGNIFICANT DIAGNOSTIC EXAMS   07-20-14: chest x-ray: Peripheral fibrosis in the lungs bilaterally. No frank edema or consolidation. Mild cardiomegaly.  07-20-14: ct of head: Atrophy with prior infarcts on the right as well as widespread small vessel disease. No intracranial mass, hemorrhage, or acute appearing infarct.   07-20-14: ct of abdomen and pelvis: 1. Diffuse soft tissue inflammation around the anorectal canal, slightly more prominent on the right, with a likely associated ulceration along the overlying right-sided perineal soft tissues. Minimal air within the right-sided soft tissues likely reflects an underlying sinus tract, without a defined abscess. No evidence of involvement of osseous structures at this time. Mild wall thickening noted along the distal rectum. 2. Mild bilateral hydronephrosis is thought to reflect marked distention of the bladder. No evidence of distal obstructing stone. Multiple large right renal cysts seen. 3. Linear 3.5 cm hypodensity within the right hepatic lobe may reflect remote injury or a cyst. 4. Cholelithiasis; gallbladder otherwise unremarkable in appearance. 5. Scattered diverticulosis along the proximal sigmoid colon, without evidence of diverticulitis. 6. Scattered calcification along the abdominal aorta and its branches.  07-29-14: chest x-ray: No acute chest findings. Prominent peribronchial and interstitial lung markings suggest chronic changes   LABS REVIEWED:   07-20-14: wbc 28.3; hgb 13.7; hct 42.2; mcv 96.1; plt 203; glucose 135; bun 67; creat 2.63; k+3.7; na++135; ast 73; albumin 3.4; blood culture: no growth; urine culture: no growth 07-21-14: wound culture (scarum) MRSA 07-25-14: wbc 16.3; hgb 13.5; hct 41.9; mcv 94.8; plt 216; glucose 89; bun 25; creat 1.21; k+3.6; na++137 07-28-14: wbc 14.5; hgb 13.4 hct  41.7; mcv 96.5; plt 294; glucose 95; bun 25; creat 1.34; k+4.4; na++136 07-29-14: wbc 15.3; hgb 13.0; hct 40.0; mcv 96.4; plt 310; glucose 132;  Bun 37; creat 1.94; k+4.0 na++136; liver normal albumin 2.3; depakote 15.8; urine culture: no growth; blood culture: no growth 07-31-14: wbc 10.6; hgb 11.4; hct 35.4; mcv 96.5; plt 275; glucose 81; bun 25; creat 1.43; k+3.9; na++134 08-01-14: wbc 10.2; hgb 11.7; hct 36.7; mcv 94.5; plt 345; gluocse 82; bun 24; creat 1.21; k+4.5; na++138    Review of Systems  Unable to perform ROS    Physical Exam Constitutional: No distress.  Frail   Neck: Neck supple. No JVD present. No thyromegaly present.  Cardiovascular: Normal rate, regular rhythm and intact distal pulses.   Respiratory: Effort normal and breath sounds normal. No respiratory distress.  GI: Soft. Bowel sounds are normal. He exhibits no distension. There is no tenderness.   Genitourinary:  Has foley   Musculoskeletal: He exhibits no edema.  Is able to move all extremities   Neurological: He is alert.  Skin: Skin is warm and dry. He is not diaphoretic.  Left gluteal wound: 1.6 x 0.5cm is superficial no tunneling present       ASSESSMENT/ PLAN:   1. Stage IV left gluteal wound: is resolving  will continue current treatment; will  roxanol 5 mg every 4 hours as needed for pain and ultram 50 mg every 6 hours as needed for pain.  and will monitor his status.   2. FTT: current weight is 127 pounds; will continue supplements per facility policy and will monitor his status.    3. Afib: heart rate is stable; is not on medications; will monitor his status.   4.  Dementia: is end stage: is presently not on medications; he is able to communicate his needs; he is eating . He has ativan 0.5 mg every 8 hours as needed for anxiety; will not make changes will monitor his status. His current weight is 127 pounds.     Synthia Innocent NP [facility]  Contact [phone] Monday through  Friday 8am- 5pm  After hours call [phone]

## 2014-12-05 NOTE — Progress Notes (Signed)
Patient ID: Darryl Summers, male   DOB: 10-26-1924, 81 y.o.   MRN: [id number]  Darryl Summers living [place]     No Known Allergies     Chief Complaint  Patient presents with  . Acute Visit    change in condition     HPI:  He has had a change in status. His urine is thick and foul and requires his foley to be irrigated in order to drain. He is awake but unable to participate in the hpi or ros. He does appear to have pain with palpation to his suprapubic area. He is restless. He is presently on rocephin for uti; his wbc is 33.3   Past Medical History  Diagnosis Date  . Atrial fibrillation   . History of stroke   . Hypertension   . Aortic insufficiency   . Moderate mitral regurgitation   . Stroke   . Pernicious anemia   . Peripheral vascular disease   . Peripheral neuropathy     Past Surgical History  Procedure Laterality Date  . Incision and drainage perirectal abscess N/A 07/26/2014    Procedure: debridement of sacral wound ;  Surgeon: Darryl Messing III, MD;  Location: [facility];  Service: General;  Laterality: N/A;  prone    VITAL SIGNS BP 120/68 mmHg  Pulse 100  Ht 5' 10" (1.778 m)  Wt 134 lb (60.782 kg)  BMI 19.23 kg/m2   Outpatient Encounter Prescriptions as of 12/02/2014  Medication Sig  . acetaminophen (TYLENOL) 325 MG tablet Take 650 mg by mouth every 6 (six) hours as needed for mild pain.  . Amino Acids-Protein Hydrolys (FEEDING SUPPLEMENT, PRO-STAT SUGAR FREE 64,) LIQD Take 30 mLs by mouth 3 (three) times daily with meals.  Marland Kitchen aspirin 325 MG tablet Take 325 mg by mouth daily.  . feeding supplement (ENSURE IMMUNE HEALTH) LIQD Take 120 mLs by mouth 2 (two) times daily.   Marland Kitchen loperamide (IMODIUM) 2 MG capsule Take 2-4 mg by mouth as needed for diarrhea or loose stools.  Marland Kitchen LORazepam (ATIVAN) 0.5 MG tablet Take 0.5 mg by mouth every 8 (eight) hours as needed for anxiety or sleep.  . mirtazapine (REMERON) 7.5 MG tablet Take 7.5 mg by mouth at bedtime.  Marland Kitchen morphine  (ROXANOL) 20 MG/ML concentrated solution Take 5 mg by mouth every 4 (four) hours as needed for severe pain.  Marland Kitchen MULTIPLE VITAMINS ESSENTIAL PO Take 1 tablet by mouth daily.  . traMADol (ULTRAM) 50 MG tablet Take 1 tablet (50 mg total) by mouth 4 (four) times daily.      SIGNIFICANT DIAGNOSTIC EXAMS  07-20-14: chest x-ray: Peripheral fibrosis in the lungs bilaterally. No frank edema or consolidation. Mild cardiomegaly.  07-20-14: ct of head: Atrophy with prior infarcts on the right as well as widespread small vessel disease. No intracranial mass, hemorrhage, or acute appearing infarct.   07-20-14: ct of abdomen and pelvis: 1. Diffuse soft tissue inflammation around the anorectal canal, slightly more prominent on the right, with a likely associated ulceration along the overlying right-sided perineal soft tissues. Minimal air within the right-sided soft tissues likely reflects an underlying sinus tract, without a defined abscess. No evidence of involvement of osseous structures at this time. Mild wall thickening noted along the distal rectum. 2. Mild bilateral hydronephrosis is thought to reflect marked distention of the bladder. No evidence of distal obstructing stone. Multiple large right renal cysts seen. 3. Linear 3.5 cm hypodensity within the right hepatic lobe may reflect remote injury or a cyst.  4. Cholelithiasis; gallbladder otherwise unremarkable in appearance. 5. Scattered diverticulosis along the proximal sigmoid colon, without evidence of diverticulitis. 6. Scattered calcification along the abdominal aorta and its branches.  07-29-14: chest x-ray: No acute chest findings. Prominent peribronchial and interstitial lung markings suggest chronic changes   LABS REVIEWED:   07-20-14: wbc 28.3; hgb 13.7; hct 42.2; mcv 96.1; plt 203; glucose 135; bun 67; creat 2.63; k+3.7; na++135; ast 73; albumin 3.4; blood culture: no growth; urine culture: no growth 07-21-14: wound culture (scarum) MRSA 07-25-14:  wbc 16.3; hgb 13.5; hct 41.9; mcv 94.8; plt 216; glucose 89; bun 25; creat 1.21; k+3.6; na++137 07-28-14: wbc 14.5; hgb 13.4 hct 41.7; mcv 96.5; plt 294; glucose 95; bun 25; creat 1.34; k+4.4; na++136 07-29-14: wbc 15.3; hgb 13.0; hct 40.0; mcv 96.4; plt 310; glucose 132;  Bun 37; creat 1.94; k+4.0 na++136; liver normal albumin 2.3; depakote 15.8; urine culture: no growth; blood culture: no growth 07-31-14: wbc 10.6; hgb 11.4; hct 35.4; mcv 96.5; plt 275; glucose 81; bun 25; creat 1.43; k+3.9; na++134 08-01-14: wbc 10.2; hgb 11.7; hct 36.7; mcv 94.5; plt 345; gluocse 82; bun 24; creat 1.21; k+4.5; na++138 12-02-14: wbc 33.3; hgb 14.8; hct 43.1; mcv 88.3; plt 302; glucose 120; bun 199; creat 7.64; k+4.1; na++137; alk phos 122; albumin 2.7; urine culture: citrobacter freundii; proteus mirabilis: rocephin      ROS Unable to perform ROS     Physical Exam Constitutional: No distress.  Frail mildly distressed   Neck: Neck supple. No JVD present. No thyromegaly present.  Cardiovascular: Normal rate, regular rhythm and intact distal pulses.   Respiratory: Effort normal and breath sounds normal. No respiratory distress.  GI: Soft. Bowel sounds are normal. He exhibits no distension. There is no tenderness.   Genitourinary:  Has foley has tenderness to palpation; urine is thick brown, gray.    Musculoskeletal: He exhibits no edema.  Is able to move all extremities   Neurological: He is alert.  Skin: Skin is warm and dry. He is not diaphoretic.  Left gluteal wound: resolved Has torn penis from pulling on foley        ASSESSMENT/ PLAN:  1. Sepsis due to uti 2. ftt 3. End stage dementia  I have had an extensive discussion with his son regarding his overall status and prognosis. He son desires for him to be kept comfortable. He does not want a transfer to the hospital. He is in agreement with continuing the rocephin in order to help provide comfort and to attempt ivf D5 NS at 100 cc per hour for  the same purpose. Will check cbc and bmp on Monday. Will stop asa remeron ultram and vitamins. Staff is to irrigate his foley in order to help drain it every 6 hours. His family desires hospice care at this time.    Time spent with patient 50 minutes.   Ok Edwards NP [facility]  Contact [phone] Monday through Friday 8am- 5pm  After hours call [phone]

## 2014-12-05 NOTE — Progress Notes (Signed)
Patient ID: Darryl Summers, male   DOB: 1925-06-11, 81 y.o.   MRN: [id number]  [name] living [place]     No Known Allergies     Chief Complaint  Patient presents with  . Medical Management of Chronic Issues    HPI:  He is a long term resident of this facility being seen for the management of his chronic illnesses. He is gaining weight current at 134 pounds. His stage IV wound has resolved. He tells me that he is feeling good; but is unable to fully participate in the hpi or ros.    Past Medical History  Diagnosis Date  . Atrial fibrillation   . History of stroke   . Hypertension   . Aortic insufficiency   . Moderate mitral regurgitation   . Stroke   . Pernicious anemia   . Peripheral vascular disease   . Peripheral neuropathy     Past Surgical History  Procedure Laterality Date  . Incision and drainage perirectal abscess N/A 07/26/2014    Procedure: debridement of sacral wound ;  Surgeon: Chevis Pretty III, MD;  Location: [facility];  Service: General;  Laterality: N/A;  prone    VITAL SIGNS BP 133/75 mmHg  Pulse 86  Ht 5\' 10"  (1.778 m)  Wt 134 lb (60.782 kg)  BMI 19.23 kg/m2   Outpatient Encounter Prescriptions as of 11/18/2014  Medication Sig  . mirtazapine (REMERON) 7.5 MG tablet Take 7.5 mg by mouth at bedtime.  Marland Kitchen acetaminophen (TYLENOL) 325 MG tablet Take 650 mg by mouth every 6 (six) hours as needed for mild pain.  . Amino Acids-Protein Hydrolys (FEEDING SUPPLEMENT, PRO-STAT SUGAR FREE 64,) LIQD Take 30 mLs by mouth 3 (three) times daily with meals.  Marland Kitchen aspirin 325 MG tablet Take 325 mg by mouth daily.  . feeding supplement (ENSURE IMMUNE HEALTH) LIQD Take 120 mLs by mouth 2 (two) times daily.   Marland Kitchen loperamide (IMODIUM) 2 MG capsule Take 2-4 mg by mouth as needed for diarrhea or loose stools.  Marland Kitchen LORazepam (ATIVAN) 0.5 MG tablet Take 0.5 mg by mouth every 8 (eight) hours as needed for anxiety or sleep.  Marland Kitchen morphine (ROXANOL) 20 MG/ML concentrated solution Take 5  mg by mouth every 4 (four) hours as needed for severe pain.  Marland Kitchen MULTIPLE VITAMINS ESSENTIAL PO Take 1 tablet by mouth daily.  . traMADol (ULTRAM) 50 MG tablet Take 1 tablet (50 mg total) by mouth 4 (four) times daily.      SIGNIFICANT DIAGNOSTIC EXAMS   07-20-14: chest x-ray: Peripheral fibrosis in the lungs bilaterally. No frank edema or consolidation. Mild cardiomegaly.  07-20-14: ct of head: Atrophy with prior infarcts on the right as well as widespread small vessel disease. No intracranial mass, hemorrhage, or acute appearing infarct.   07-20-14: ct of abdomen and pelvis: 1. Diffuse soft tissue inflammation around the anorectal canal, slightly more prominent on the right, with a likely associated ulceration along the overlying right-sided perineal soft tissues. Minimal air within the right-sided soft tissues likely reflects an underlying sinus tract, without a defined abscess. No evidence of involvement of osseous structures at this time. Mild wall thickening noted along the distal rectum. 2. Mild bilateral hydronephrosis is thought to reflect marked distention of the bladder. No evidence of distal obstructing stone. Multiple large right renal cysts seen. 3. Linear 3.5 cm hypodensity within the right hepatic lobe may reflect remote injury or a cyst. 4. Cholelithiasis; gallbladder otherwise unremarkable in appearance. 5. Scattered diverticulosis along the proximal  sigmoid colon, without evidence of diverticulitis. 6. Scattered calcification along the abdominal aorta and its branches.  07-29-14: chest x-ray: No acute chest findings. Prominent peribronchial and interstitial lung markings suggest chronic changes   LABS REVIEWED:   07-20-14: wbc 28.3; hgb 13.7; hct 42.2; mcv 96.1; plt 203; glucose 135; bun 67; creat 2.63; k+3.7; na++135; ast 73; albumin 3.4; blood culture: no growth; urine culture: no growth 07-21-14: wound culture (scarum) MRSA 07-25-14: wbc 16.3; hgb 13.5; hct 41.9; mcv 94.8; plt  216; glucose 89; bun 25; creat 1.21; k+3.6; na++137 07-28-14: wbc 14.5; hgb 13.4 hct 41.7; mcv 96.5; plt 294; glucose 95; bun 25; creat 1.34; k+4.4; na++136 07-29-14: wbc 15.3; hgb 13.0; hct 40.0; mcv 96.4; plt 310; glucose 132;  Bun 37; creat 1.94; k+4.0 na++136; liver normal albumin 2.3; depakote 15.8; urine culture: no growth; blood culture: no growth 07-31-14: wbc 10.6; hgb 11.4; hct 35.4; mcv 96.5; plt 275; glucose 81; bun 25; creat 1.43; k+3.9; na++134 08-01-14: wbc 10.2; hgb 11.7; hct 36.7; mcv 94.5; plt 345; gluocse 82; bun 24; creat 1.21; k+4.5; na++138     ROS Unable to perform ROS     Physical Exam Constitutional: No distress.  Frail   Neck: Neck supple. No JVD present. No thyromegaly present.  Cardiovascular: Normal rate, regular rhythm and intact distal pulses.   Respiratory: Effort normal and breath sounds normal. No respiratory distress.  GI: Soft. Bowel sounds are normal. He exhibits no distension. There is no tenderness.   Genitourinary:  Has foley   Musculoskeletal: He exhibits no edema.  Is able to move all extremities   Neurological: He is alert.  Skin: Skin is warm and dry. He is not diaphoretic.  Left gluteal wound: resolved      ASSESSMENT/ PLAN:   1. Stage IV left gluteal wound: has resolved  2. FTT: current weight is 134 pounds; will continue remeron 7.5 mig nightly; supplements per facility policy and will monitor his status.  He has ultram 50 mg every 6 hours as needed for pain and roxanol 5 mg every 4 hours as needed for pain   3. Afib: heart rate is stable; will continue asa 325 mg daily; will monitor his status.   4.  Dementia: is end stage: is presently not on medications; he is able to communicate his needs; he is eating . He has ativan 0.5 mg every 8 hours as needed for anxiety; will not make changes will monitor his status. His current weight is 127 pounds.     Synthia Innocent NP [facility]  Contact [phone] Monday through  Friday 8am- 5pm  After hours call [phone]

## 2014-12-16 DEATH — deceased

## 2016-12-26 IMAGING — CT CT HEAD W/O CM
4 of 6 series · 12 of 30 positions shown, 13 images · non-contrast
Comparison: May 12, 2014

CLINICAL DATA: Altered mental status.  Dementia

EXAM:
CT HEAD WITHOUT CONTRAST
TECHNIQUE: Contiguous axial images were obtained from the base of the skull
through the vertex without intravenous contrast.

[Series 3: head w/o · axial · non-contrast · 0.45mm/px · z∈[-192,-117]mm · 3 of 31 slices shown, 4 images (1 of 3)]
[im 8/31  brain]
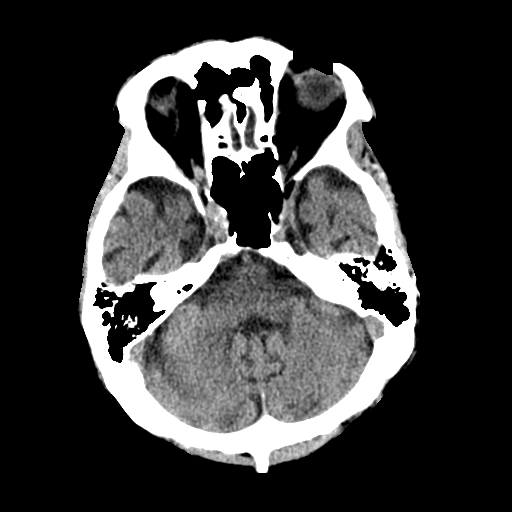
[im 8/31  bone]
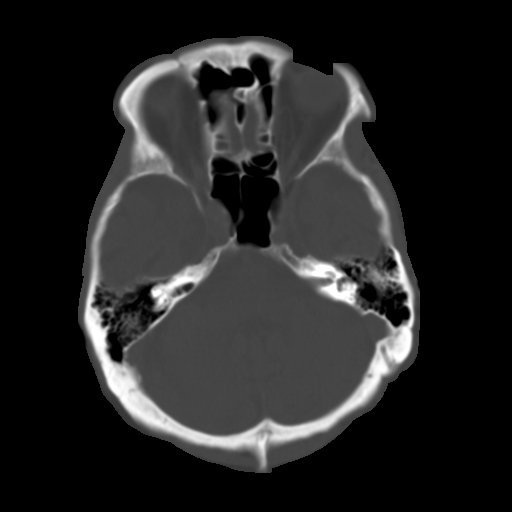
[im 16/31  brain]
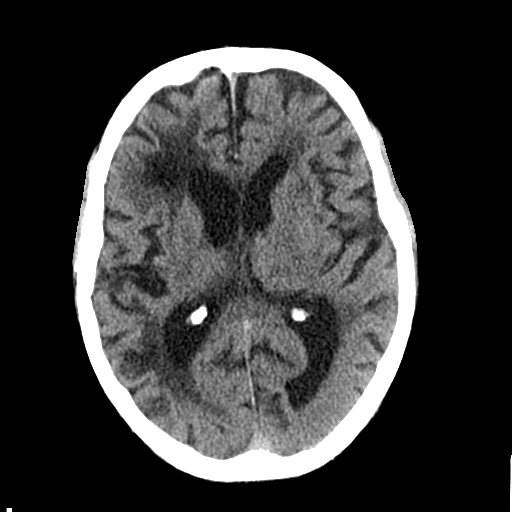
[im 23/31  brain]
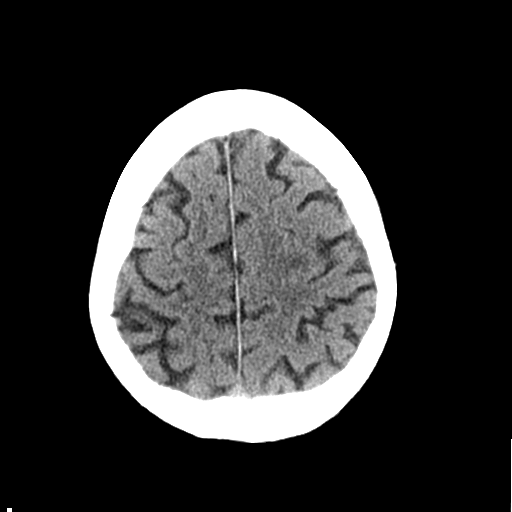

[Series 4: bone windows · axial · 0.45mm/px · z∈[-192,-117]mm · 3 of 31 slices shown]
[im 8/31  bone]
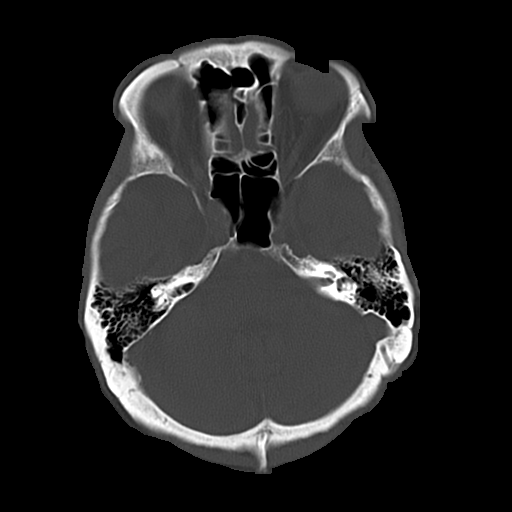
[im 16/31  bone]
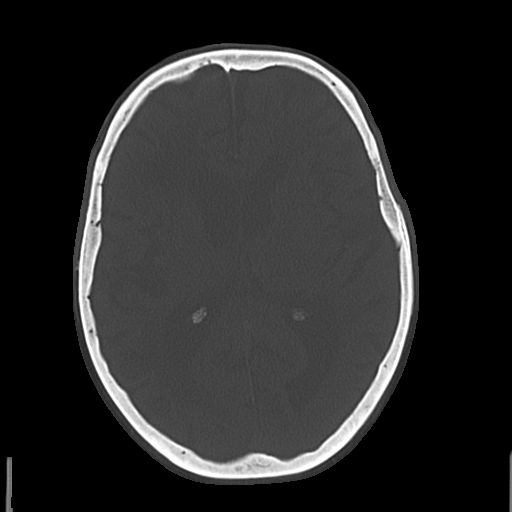
[im 23/31  bone]
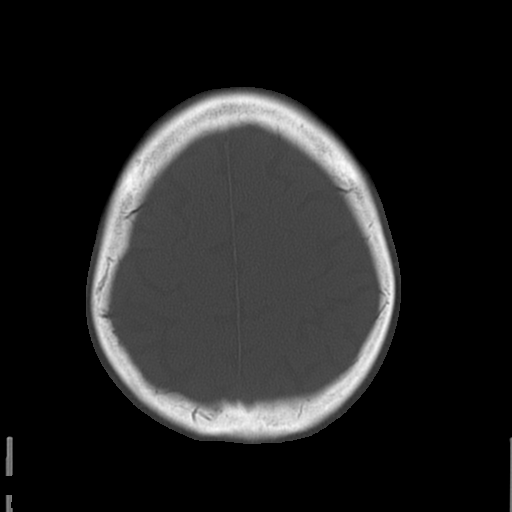

[Series 5: head w/o · axial · non-contrast · 0.45mm/px · z∈[-192,-117]mm · 3 of 31 slices shown (2 of 3)]
[im 8/31  brain]
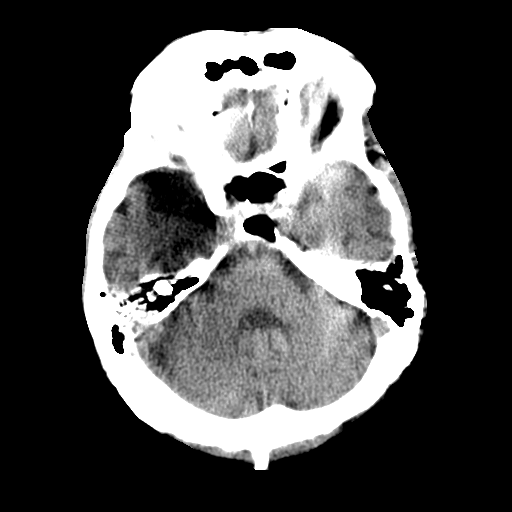
[im 16/31  brain]
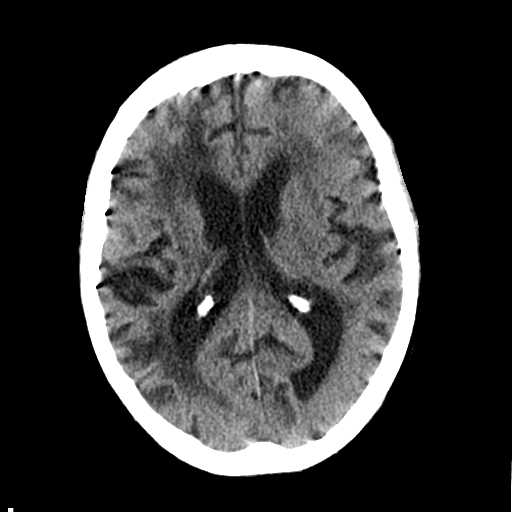
[im 23/31  brain]
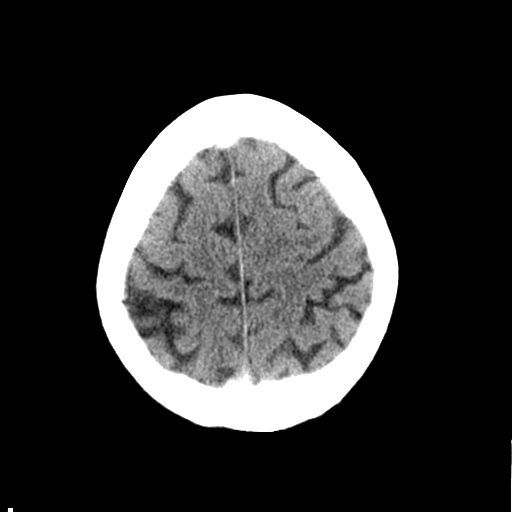

[Series 7: head w/o · axial · non-contrast · 0.43mm/px · z∈[-216,-141]mm · 3 of 31 slices shown (3 of 3)]
[im 8/31  brain]
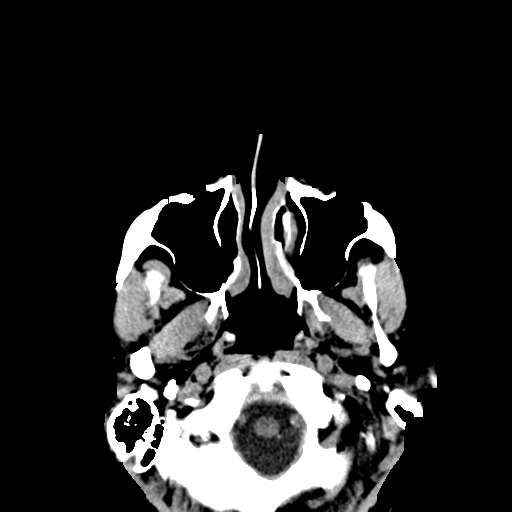
[im 16/31  brain]
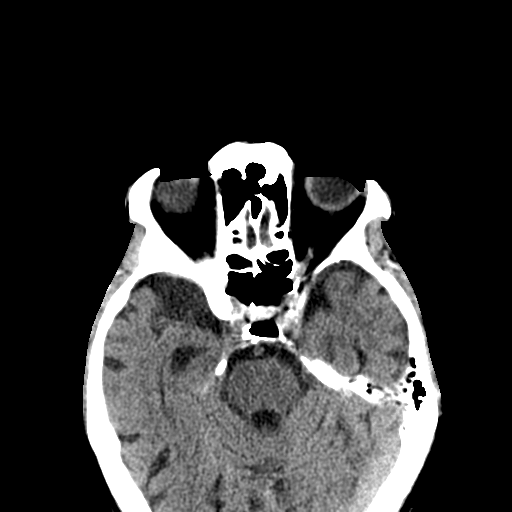
[im 23/31  brain]
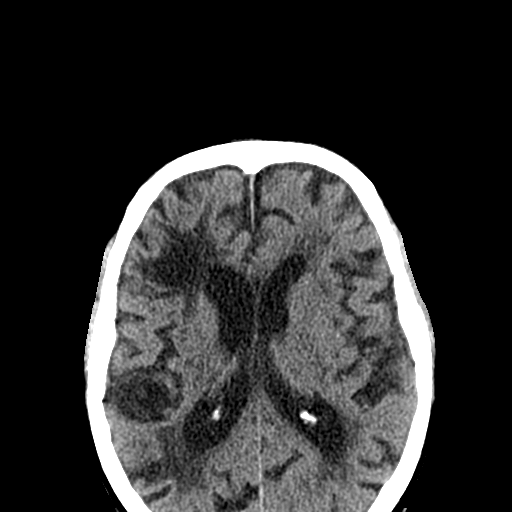

[12 of 30 positions shown; findings below may reference images not displayed]

FINDINGS: Moderate diffuse atrophy is stable. There is no intracranial mass,
hemorrhage, extra-axial fluid collection, or midline shift. There is
evidence of a prior infarct in the inferior right frontal lobe with
some involvement of the anterior most aspect of the inferior right
temporal lobe. There is evidence of a prior infarct at the superior
temporal -occipital junction on the right, stable. There is
extensive small vessel disease throughout the centra semiovale
bilaterally. There is small vessel disease throughout both external
capsules. There is no new gray-white compartment lesion. No acute
infarct apparent. Bony calvarium appears intact. The mastoid air
cells are clear.
IMPRESSION: Atrophy with prior infarcts on the right as well as widespread small
vessel disease. No intracranial mass, hemorrhage, or acute appearing
infarct.

## 2017-01-04 IMAGING — CR DG CHEST 1V PORT
1 series · 1 of 1 positions shown · non-contrast
Comparison: 07/20/2014

CLINICAL DATA: 89-year-old with a skin ulcer.

EXAM:
PORTABLE CHEST - 1 VIEW

[AP]
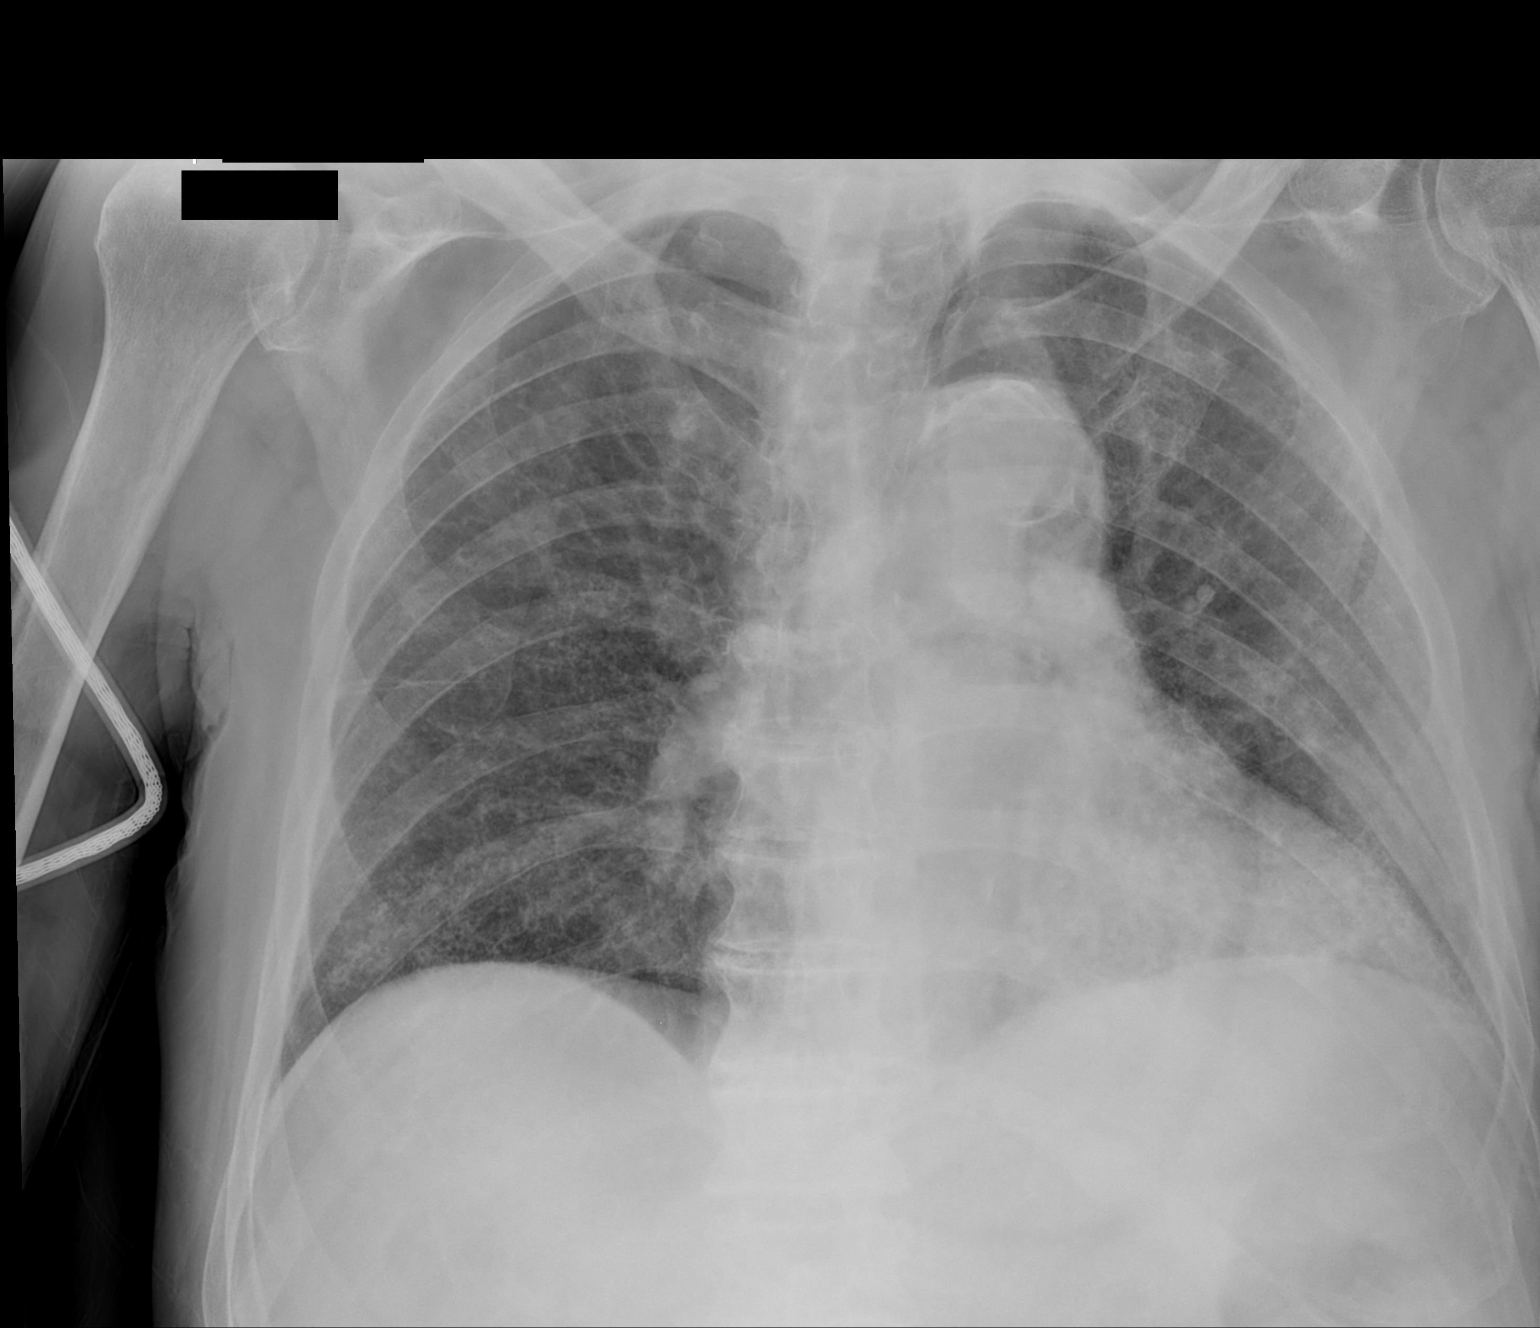

[1 of 1 positions shown; findings below may reference images not displayed]

FINDINGS: Patient is rotated towards the left on this examination. Heart size
is mildly enlarged but unchanged. Atherosclerotic calcifications at
the aortic arch. Prominent interstitial and/or peribronchial
markings are noted throughout both lungs and this appears to be
chronic. No focal airspace disease.
IMPRESSION: No acute chest findings.

Prominent peribronchial and interstitial lung markings suggest
chronic changes.
